# Patient Record
Sex: Male | Born: 1950 | Race: White | Hispanic: No | Marital: Married | Smoking: Current every day smoker
Health system: Southern US, Community
[De-identification: ages and names within clinical notes are randomized; demographics above are authoritative.]

## PROBLEM LIST (undated history)

## (undated) DIAGNOSIS — R17 Unspecified jaundice: Secondary | ICD-10-CM

## (undated) DIAGNOSIS — E119 Type 2 diabetes mellitus without complications: Secondary | ICD-10-CM

## (undated) DIAGNOSIS — N200 Calculus of kidney: Secondary | ICD-10-CM

## (undated) DIAGNOSIS — I10 Essential (primary) hypertension: Secondary | ICD-10-CM

## (undated) HISTORY — DX: Calculus of kidney: N20.0

## (undated) HISTORY — DX: Type 2 diabetes mellitus without complications: E11.9

## (undated) HISTORY — PX: CHOLECYSTECTOMY: SHX55

## (undated) HISTORY — DX: Essential (primary) hypertension: I10

---

## 1999-02-24 ENCOUNTER — Emergency Department (HOSPITAL_COMMUNITY): Admission: EM | Admit: 1999-02-24 | Discharge: 1999-02-24 | Payer: Self-pay | Admitting: Emergency Medicine

## 2003-08-24 HISTORY — PX: UMBILICAL HERNIA REPAIR: SHX196

## 2003-08-24 HISTORY — PX: APPENDECTOMY: SHX54

## 2003-12-09 ENCOUNTER — Inpatient Hospital Stay (HOSPITAL_COMMUNITY): Admission: EM | Admit: 2003-12-09 | Discharge: 2003-12-12 | Payer: Self-pay | Admitting: Emergency Medicine

## 2007-09-11 ENCOUNTER — Emergency Department (HOSPITAL_COMMUNITY): Admission: EM | Admit: 2007-09-11 | Discharge: 2007-09-11 | Payer: Self-pay | Admitting: Emergency Medicine

## 2010-07-28 ENCOUNTER — Emergency Department (HOSPITAL_COMMUNITY)
Admission: EM | Admit: 2010-07-28 | Discharge: 2010-07-28 | Payer: Self-pay | Source: Home / Self Care | Admitting: Emergency Medicine

## 2010-11-02 LAB — BASIC METABOLIC PANEL
BUN: 14 mg/dL (ref 6–23)
CO2: 27 mEq/L (ref 19–32)
Calcium: 9.7 mg/dL (ref 8.4–10.5)
Chloride: 99 mEq/L (ref 96–112)
Creatinine, Ser: 0.98 mg/dL (ref 0.4–1.5)
GFR calc non Af Amer: >60 mL/min (ref >60)
Potassium: 4.2 mEq/L (ref 3.5–5.1)

## 2010-11-02 LAB — URINALYSIS, ROUTINE W REFLEX MICROSCOPIC
Bilirubin Urine: NEGATIVE
Leukocytes, UA: NEGATIVE
Protein, ur: NEGATIVE mg/dL
Specific Gravity, Urine: >1.03 — ABNORMAL HIGH (ref 1.005–1.030)
Urobilinogen, UA: 0.2 mg/dL (ref 0.0–1.0)

## 2010-11-02 LAB — CBC
HCT: 48.2 % (ref 39.0–52.0)
Hemoglobin: 18 g/dL — ABNORMAL HIGH (ref 13.0–17.0)
RBC: 5.66 MIL/uL (ref 4.22–5.81)
RDW: 12.8 % (ref 11.5–15.5)
WBC: 16.2 10*3/uL — ABNORMAL HIGH (ref 4.0–10.5)

## 2010-11-02 LAB — URINE MICROSCOPIC-ADD ON

## 2010-11-02 LAB — URINE CULTURE: Colony Count: NO GROWTH

## 2011-01-08 NOTE — Discharge Summary (Signed)
NAME:  Anthony Mcmahon, Anthony Mcmahon                       ACCOUNT NO.:  [id number]   MEDICAL RECORD NO.:  [id number]                   PATIENT TYPE:  INP   LOCATION:  A322                                 FACILITY:  [facility]   PHYSICIAN:  Dalia Heading, M.D.               DATE OF BIRTH:  [birth year]   DATE OF ADMISSION:  12/07/2003  DATE OF DISCHARGE:  12/12/2003                                 DISCHARGE SUMMARY   HOSPITAL COURSE SUMMARY:  The patient is a 60 year old white male who  presented to the emergency room with right lower quadrant abdominal pain.  CT scan of the abdomen and pelvis revealed acute appendicitis with possible  perforation.  The patient was taken to the operating room on December 08, 2003  and underwent both a laparoscopic appendectomy and umbilical hernia.  An  acute appendicitis with perforation was noted. He was admitted to the  hospital for further treatment.  His postoperative course was remarkable for  a postoperative ileus which has subsequently resolved.  His diet was  advanced once his ileus had resolved. His white blood cell count has  returned to normal. His Jackson-Pratt drain in the right lower quadrant has  been removed.   The patient is being discharged home on December 12, 2003 in good and improving  condition.   DISCHARGE INSTRUCTIONS:  The patient is to followup with Dr. Franky Macho on  December 17, 2003.   DISCHARGE MEDICATIONS:  1. Tylenol or Motrin as needed for pain.  2. He is to resume his previous prescriptions as prescribed.   PRINCIPAL DIAGNOSES:  1. Acute appendicitis with perforation.  2. Umbilical hernia.   PRINCIPAL PROCEDURE:  On December 08, 2003:  1. Laparoscopic appendectomy.  2. Umbilical herniorrhaphy.     ___________________________________________                                         Dalia Heading, M.D.   MAJ/MEDQ  D:  12/12/2003  T:  12/13/2003  Job:  [id number]   cc:   Dalia Heading, M.D.  [address] [place]  [place] [address]  Fax: [phone]   Francoise Schaumann. Halm, D.O.  [address], [address]  [place]  [place] [address]  Fax: [phone]

## 2011-01-08 NOTE — Op Note (Signed)
NAME:  MAXWEL, MEADOWCROFT                       ACCOUNT NO.:  [id number]   MEDICAL RECORD NO.:  [id number]                   PATIENT TYPE:  EMS   LOCATION:  ED                                   FACILITY:  [facility]   PHYSICIAN:  Dalia Heading, M.D.               DATE OF BIRTH:  01/14/1951   DATE OF PROCEDURE:  12/08/2003  DATE OF DISCHARGE:                                 OPERATIVE REPORT   PREOPERATIVE DIAGNOSES:  1. Acute appendicitis.  2. Umbilical hernia.   POSTOPERATIVE DIAGNOSES:  1. Acute appendicitis, with perforation.  2. Umbilical hernia.   PROCEDURE:  1. Laparoscopic appendectomy.  2. Umbilical herniorrhaphy.   SURGEON:  Dalia Heading, M.D.   ANESTHESIA:  General endotracheal.   INDICATIONS:  The patient is a 60 year old white male who presents with  right lower quadrant abdominal pain to the emergency room.  CT scan of the  abdominal pain and pelvis reveals acute appendicitis with possible  perforation.  In addition, he has an umbilical hernia.  The risks and  benefits of the procedures including bleeding, infection, and the  possibility of an open procedure were fully explained to the patient, who  gave informed consent.   DESCRIPTION OF PROCEDURE:  The patient was placed in the supine position.  After induction of general endotracheal anesthesia, the  abdomen was prepped  and draped using the usual sterile technique with Betadine. Surgical site  confirmation was performed.   A supraumbilical incision was made down to the fascia.  A Veress needle was  introduced into the abdominal cavity and confirmation of placement was done  using the saline drop test.  The abdomen was then insufflated to 16 mmHg  pressure.  An 11-mm trocar was introduced into the abdominal cavity under  direct visualization without difficulty.  The patient was placed in deeper  Trendelenburg position and an additional 11-mm trocar was placed in the  suprapubic region and a 5-mm trocar was  placed in the left lower quadrant  region.  The right lower quadrant was inspected and a small amount of  purulent fluid was found.  This was evacuated without difficulty.  The small  bowel was noted to be overlying the appendix.  Fibrinous exudate was  present.  The appendix was mobilized to its base.  An endoGIA was placed  across the base of the appendix and fired.  The appendix was removed using  an EndoCatch bag without difficulty.  The staple line was inspected and  noted to be within normal limits.   The right lower quadrant was copiously irrigated with normal saline.  A #10  flat Jackson-Pratt drain was placed into the right lower quadrant and  brought out through the 5-mm trocar site.  This was secured at the skin  level using a 3-0 nylon interrupted suture.  All fluid and air were then  evacuated from the abdominal cavity prior to removal of  the trocars.   All wounds were irrigated with normal saline.  All wounds were injected with  0.5% Sensorcaine.  The supraumbilical incision was extended and the  umbilicus was freed away from the underlying fascia.  The incision and  umbilical hernia were then reapproximated using #0 [place] interrupted  sutures.  The base of the umbilicus was secured back to the fascia using a 2-  0 Vicryl interrupted suture.  The suprapubic fascia was reapproximated using  an #0 Vicryl interrupted suture.  The skin incisions were closed using  staples.  Betadine ointment and dry sterile dressings were applied.   All tape and needle counts were correct at the end of the procedure.  The  patient was extubated in the operating room and went back to recovery room  awake, in stable condition.   COMPLICATIONS:  None.   SPECIMEN:  Appendix.   BLOOD LOSS:  Less than 50 cc.   DRAINS:  Jackson-Pratt drain to the right lower quadrant.      ___________________________________________                                            Dalia Heading, M.D.    MAJ/MEDQ  D:  12/08/2003  T:  12/08/2003  Job:  [id number]   cc:   Dalia Heading, M.D.  [address] [place]  [place] [address]  Fax: [phone]   Francoise Schaumann. Halm, D.O.  [address], [address]  [place]  [place] [address]  Fax: [phone]

## 2011-05-13 LAB — DIFFERENTIAL
Basophils Absolute: 0.1
Basophils Relative: 1
Eosinophils Relative: 1
Lymphocytes Relative: 33
Lymphs Abs: 4.1 — ABNORMAL HIGH
Monocytes Absolute: 1
Neutrophils Relative %: 58

## 2011-05-13 LAB — BASIC METABOLIC PANEL
CO2: 24
Chloride: 104
Creatinine, Ser: 1.22
GFR calc Af Amer: >60
GFR calc non Af Amer: >60
Glucose, Bld: 178 — ABNORMAL HIGH

## 2011-05-13 LAB — URINALYSIS, ROUTINE W REFLEX MICROSCOPIC
Bilirubin Urine: NEGATIVE
Ketones, ur: NEGATIVE
Leukocytes, UA: NEGATIVE
Nitrite: NEGATIVE
Protein, ur: NEGATIVE
Urobilinogen, UA: 0.2

## 2011-05-13 LAB — CBC
Hemoglobin: 16.1
MCHC: 33.8
WBC: 12.6 — ABNORMAL HIGH

## 2011-07-05 ENCOUNTER — Telehealth: Payer: Self-pay

## 2011-07-05 NOTE — Telephone Encounter (Signed)
Called pt to triage for colonoscopy. He has a lot of loose stools. OV with Tana Coast 07/06/2011 @ 10:30 AM.

## 2011-07-06 ENCOUNTER — Encounter: Payer: Self-pay | Admitting: Gastroenterology

## 2011-07-06 ENCOUNTER — Ambulatory Visit (INDEPENDENT_AMBULATORY_CARE_PROVIDER_SITE_OTHER): Payer: Managed Care, Other (non HMO) | Admitting: Gastroenterology

## 2011-07-06 DIAGNOSIS — K589 Irritable bowel syndrome without diarrhea: Secondary | ICD-10-CM

## 2011-07-06 DIAGNOSIS — Z1211 Encounter for screening for malignant neoplasm of colon: Secondary | ICD-10-CM

## 2011-07-06 NOTE — Assessment & Plan Note (Signed)
Colon cancer screening due. I have discussed the risks, alternatives, benefits with regards to but not limited to the risk of reaction to medication, bleeding, infection, perforation and the patient is agreeable to proceed. Written consent to be obtained.  Day of prep, decrease metformin by 1/2.  Consider terminal ileoscopy at time of TCS given chronic bowel issues.

## 2011-07-06 NOTE — Patient Instructions (Signed)
We have scheduled you for a colonoscopy. Please see separate instructions. 

## 2011-07-06 NOTE — Progress Notes (Signed)
Primary Care Physician:  Ara Kussmaul, MD  Primary Gastroenterologist: Roetta Sessions, MD    Chief Complaint  Patient presents with  . Colonoscopy    HPI:  Anthony Mcmahon is a 60 y.o. male here to schedule colonoscopy. He had a colonoscopy over 20 years ago. He does not remember being told he had polyps. Overall he is doing well. Chronically he has intermittent constipation and diarrhea with occasional postprandial urgency. He may have irritable bowel syndrome. No melena, brbpr, abd pain. Rarely has heartburn, occasionally takes Zantac with certainty. Denies difficulty swallowing. He has lost 35 pounds since January with change in portion size. However he did find out recently that he is a diabetic and is now on metformin.  Current Outpatient Prescriptions  Medication Sig Dispense Refill  . metFORMIN (GLUCOPHAGE) 500 MG tablet Take 500 mg by mouth 2 (two) times daily with a meal.         Allergies as of 07/06/2011  . (No Known Allergies)    Past Medical History  Diagnosis Date  . HTN (hypertension)   . DM (diabetes mellitus)   . Nephrolithiasis     Past Surgical History  Procedure Date  . Umbilical hernia repair 2005  . Appendectomy 2005  . Cholecystectomy 1980s    Family History  Problem Relation Age of Onset  . Colon cancer Neg Hx   . GI problems Grandchild     Two grandsons with h/o Hirsprung's    History   Social History  . Marital Status: Married    Spouse Name: N/A    Number of Children: 2  . Years of Education: N/A   Occupational History  .  [employer]   Social History Main Topics  . Smoking status: Current Everyday Smoker -- 1.0 packs/day    Types: Cigarettes  . Smokeless tobacco: Not on file  . Alcohol Use: Yes     once -twice a month  . Drug Use: No  . Sexually Active: Not on file   Other Topics Concern  . Not on file   Social History Narrative  . No narrative on file      ROS:  General: Negative for anorexia, unintentional weight loss,  fever, chills, fatigue, weakness. Eyes: Negative for vision changes.  ENT: Negative for hoarseness, difficulty swallowing , nasal congestion. CV: Negative for chest pain, angina, palpitations, dyspnea on exertion, peripheral edema.  Respiratory: Negative for dyspnea at rest, dyspnea on exertion, cough, sputum, wheezing.  GI: See history of present illness. GU:  Negative for dysuria, hematuria, urinary incontinence, urinary frequency, nocturnal urination.  MS: Negative for joint pain, low back pain.  Derm: Negative for rash or itching.  Neuro: Negative for weakness, abnormal sensation, seizure, frequent headaches, memory loss, confusion.  Psych: Negative for anxiety, depression, suicidal ideation, hallucinations.  Endo: Negative for unusual weight change.  Heme: Negative for bruising or bleeding. Allergy: Negative for rash or hives.    Physical Examination:  BP 126/86  Pulse 69  Temp(Src) 97.3 F (36.3 C) (Temporal)  Ht 5\' 9"  (1.753 m)  Wt 216 lb 12.8 oz (98.34 kg)  BMI 32.02 kg/m2   General: Well-nourished, well-developed in no acute distress.  Head: Normocephalic, atraumatic.   Eyes: Conjunctiva pink, no icterus. Mouth: Oropharyngeal mucosa moist and pink , no lesions erythema or exudate. Neck: Supple without thyromegaly, masses, or lymphadenopathy.  Lungs: Clear to auscultation bilaterally.  Heart: Regular rate and rhythm, no murmurs rubs or gallops.  Abdomen: Bowel sounds are normal, very mild right  lower quadrant tenderness to palpation, nondistended, no hepatosplenomegaly or masses, no abdominal bruits or    hernia , no rebound or guarding.   Rectal: Deferred to time of colonoscopy. Extremities: No lower extremity edema. No clubbing or deformities.  Neuro: Alert and oriented x 4 , grossly normal neurologically.  Skin: Warm and dry, no rash or jaundice.   Psych: Alert and cooperative, normal mood and affect.  Labs: 06/25/2011. White blood cell count 9000, hemoglobin 16.9,  hematocrit 48.4, MCV 86.3, platelets 161,000, sodium 138, potassium 3.9, glucose 195, BUN 14, creatinine 0.83, total bilirubin 1.4, alkaline phosphatase 123, AST 20, ALT 23, albumin 4.1, calcium 9.2, TSH 1.2-1, hemoglobin A1c 11.7.

## 2011-07-06 NOTE — Assessment & Plan Note (Signed)
Chronic (20 year history) alternating constipation and diarrhea. No blood in the stool. Suspect irritable bowel syndrome. Denies abdominal pain but on exam has some minimal right lower quadrant tenderness. In 2005 he had pretty bad appendicitis requiring prolonged hospitalization for antibiotics. He may have some scar tissue. Consider ileoscopy at time of colonoscopy to make sure there is no Crohn's disease.

## 2011-07-07 NOTE — Progress Notes (Signed)
Cc to PCP 

## 2011-08-09 ENCOUNTER — Telehealth: Payer: Self-pay | Admitting: Internal Medicine

## 2011-08-09 NOTE — Telephone Encounter (Signed)
I spoke with the patient and explained his bill.  He voiced understanding.

## 2011-08-09 NOTE — Telephone Encounter (Signed)
Pt called to say he has a procedure on 08/30/11 and his insurance wasn't billed as a wellness program where it wouldn't cost him anything. I told him multiple times that we don't handle the billing and offered him the number to call. He would not take the number and said he was told he needed to call our office to resubmit the bill. I told him he could talk to my office manager when she returns. Please call him ASAP at [phone]

## 2011-08-27 ENCOUNTER — Telehealth: Payer: Self-pay | Admitting: Gastroenterology

## 2011-08-27 ENCOUNTER — Encounter (HOSPITAL_COMMUNITY): Payer: Self-pay | Admitting: Pharmacy Technician

## 2011-08-27 MED ORDER — SODIUM CHLORIDE 0.45 % IV SOLN
Freq: Once | INTRAVENOUS | Status: AC
  Administered 2011-08-30: 08:00:00 via INTRAVENOUS

## 2011-08-27 NOTE — Telephone Encounter (Signed)
Gastroenterology Pre-Procedure Form  Request Date: 08/27/11,  Requesting Physician:      PATIENT INFORMATION:  Anthony Mcmahon is a 61 y.o., male (DOB=12/22/1950).  PROCEDURE: Procedure(s) requested: colonoscopy Procedure Reason: screening for colon cancer  PATIENT REVIEW QUESTIONS: The patient reports the following:   1. Diabetes Melitis: Yes 2. Joint replacements in the past 12 months: no 3. Major health problems in the past 3 months: no 4. Has an artificial valve or MVP:no 5. Has been advised in past to take antibiotics in advance of a procedure like teeth cleaning: no}    MEDICATIONS & ALLERGIES:    Patient reports the following regarding taking any blood thinners:   Plavix? no Aspirin?no Coumadin?  no  Patient confirms/reports the following medications:  Current Outpatient Prescriptions  Medication Sig Dispense Refill  . metFORMIN (GLUCOPHAGE) 500 MG tablet Take 500 mg by mouth 2 (two) times daily with a meal.         Patient confirms/reports the following allergies:  No Known Allergies  Patient is appropriate to schedule for requested procedure(s): yes  AUTHORIZATION INFORMATION   No orders of the defined types were placed in this encounter.    SCHEDULE INFORMATION: Procedure has been scheduled as follows:  Date: 08/30/11, Time: 7:30  Location: [facility]   This Gastroenterology Pre-Precedure Form is being routed to the following provider(s) for review: R. Roetta Sessions, MD

## 2011-08-29 NOTE — Telephone Encounter (Signed)
Who is requesting / referring MD??????  Hold metformin night before procedure

## 2011-08-30 ENCOUNTER — Encounter (HOSPITAL_COMMUNITY)
Admission: RE | Disposition: A | Discharge status: Home / Self Care | Payer: Self-pay | Source: Ambulatory Visit | Attending: Internal Medicine

## 2011-08-30 ENCOUNTER — Encounter (HOSPITAL_COMMUNITY): Payer: Self-pay | Admitting: *Deleted

## 2011-08-30 ENCOUNTER — Ambulatory Visit (HOSPITAL_COMMUNITY)
Admission: RE | Admit: 2011-08-30 | Discharge: 2011-08-30 | Disposition: A | Discharge status: Home / Self Care | Payer: Managed Care, Other (non HMO) | Source: Ambulatory Visit | Attending: Internal Medicine | Admitting: Internal Medicine

## 2011-08-30 ENCOUNTER — Other Ambulatory Visit: Payer: Self-pay | Admitting: Internal Medicine

## 2011-08-30 DIAGNOSIS — K589 Irritable bowel syndrome without diarrhea: Secondary | ICD-10-CM

## 2011-08-30 DIAGNOSIS — D126 Benign neoplasm of colon, unspecified: Secondary | ICD-10-CM

## 2011-08-30 DIAGNOSIS — Z01812 Encounter for preprocedural laboratory examination: Secondary | ICD-10-CM | POA: Insufficient documentation

## 2011-08-30 DIAGNOSIS — Z1211 Encounter for screening for malignant neoplasm of colon: Secondary | ICD-10-CM

## 2011-08-30 DIAGNOSIS — Z79899 Other long term (current) drug therapy: Secondary | ICD-10-CM | POA: Insufficient documentation

## 2011-08-30 DIAGNOSIS — E119 Type 2 diabetes mellitus without complications: Secondary | ICD-10-CM | POA: Insufficient documentation

## 2011-08-30 DIAGNOSIS — I10 Essential (primary) hypertension: Secondary | ICD-10-CM | POA: Insufficient documentation

## 2011-08-30 HISTORY — PX: COLONOSCOPY: SHX5424

## 2011-08-30 LAB — GLUCOSE, CAPILLARY: Glucose-Capillary: 141 mg/dL — ABNORMAL HIGH (ref 70–99)

## 2011-08-30 SURGERY — COLONOSCOPY
Anesthesia: Moderate Sedation

## 2011-08-30 MED ORDER — STERILE WATER FOR IRRIGATION IR SOLN
Status: DC | PRN
  Administered 2011-08-30: 08:00:00

## 2011-08-30 MED ORDER — MEPERIDINE HCL 100 MG/ML IJ SOLN
INTRAMUSCULAR | Status: DC | PRN
  Administered 2011-08-30: 50 mg via INTRAVENOUS
  Administered 2011-08-30: 25 mg via INTRAVENOUS

## 2011-08-30 MED ORDER — MIDAZOLAM HCL 5 MG/5ML IJ SOLN
INTRAMUSCULAR | Status: AC
  Filled 2011-08-30: qty 10

## 2011-08-30 MED ORDER — MIDAZOLAM HCL 5 MG/5ML IJ SOLN
INTRAMUSCULAR | Status: DC | PRN
  Administered 2011-08-30 (×2): 1 mg via INTRAVENOUS
  Administered 2011-08-30: 2 mg via INTRAVENOUS

## 2011-08-30 MED ORDER — MEPERIDINE HCL 100 MG/ML IJ SOLN
INTRAMUSCULAR | Status: AC
  Filled 2011-08-30: qty 2

## 2011-08-30 NOTE — H&P (Signed)
  Primary Care Physician:  Ara Kussmaul, MD, MD Primary Gastroenterologist:  Dr. Jena Gauss  Pre-Procedure History & Physical: HPI:  Anthony Mcmahon is a 61 y.o. male is here for a screening colonoscopy. Rigid proctosigmoidoscopy by description some 20 years. No prior complete  colonoscopy. No alarm symptoms. No family history of colon cancer or polyps..  Past Medical History  Diagnosis Date  . HTN (hypertension)   . DM (diabetes mellitus)   . Nephrolithiasis     Past Surgical History  Procedure Date  . Umbilical hernia repair 2005  . Appendectomy 2005  . Cholecystectomy 1980s    Prior to Admission medications   Medication Sig Start Date End Date Taking? Authorizing Provider  metFORMIN (GLUCOPHAGE) 500 MG tablet Take 500 mg by mouth 2 (two) times daily with a meal.  07/01/11   Historical Provider, MD    Allergies as of 07/06/2011  . (No Known Allergies)    Family History  Problem Relation Age of Onset  . Colon cancer Neg Hx   . GI problems Grandchild     Two grandsons with h/o Hirsprung's    History   Social History  . Marital Status: Married    Spouse Name: N/A    Number of Children: 2  . Years of Education: N/A   Occupational History  .  [employer]   Social History Main Topics  . Smoking status: Current Everyday Smoker -- 1.0 packs/day    Types: Cigarettes  . Smokeless tobacco: Not on file  . Alcohol Use: Yes     once -twice a month  . Drug Use: No  . Sexually Active: Not on file   Other Topics Concern  . Not on file   Social History Narrative  . No narrative on file    Review of Systems: See HPI, otherwise negative ROS  Physical Exam: There were no vitals taken for this visit. General:   Alert,  Well-developed, well-nourished, pleasant and cooperative in NAD Head:  Normocephalic and atraumatic. Eyes:  Sclera clear, no icterus.   Conjunctiva pink. Ears:  Normal auditory acuity. Nose:  No deformity, discharge,  or lesions. Mouth:  No deformity or  lesions, dentition normal. Neck:  Supple; no masses or thyromegaly. Lungs:  Clear throughout to auscultation.   No wheezes, crackles, or rhonchi. No acute distress. Heart:  Regular rate and rhythm; no murmurs, clicks, rubs,  or gallops. Abdomen: Obese. Positive bowel sounds soft nontender without mass or organomegaly.  Msk:  Symmetrical without gross deformities. Normal posture. Pulses:  Normal pulses noted. Extremities:  Without clubbing or edema. Neurologic:  Alert and  oriented x4;  grossly normal neurologically. Skin:  Intact without significant lesions or rashes. Cervical Nodes:  No significant cervical adenopathy. Psych:  Alert and cooperative. Normal mood and affect.  Impression/Plan: Anthony Mcmahon is now here to undergo a screening colonoscopy. First-ever average risk screening examination via colonoscopy  Risks, benefits, limitations, imponderables and alternatives regarding colonoscopy have been reviewed with the patient. Questions have been answered. All parties agreeable.

## 2011-08-30 NOTE — Op Note (Signed)
[facility] [address] [place], [place]  [address]  COLONOSCOPY PROCEDURE REPORT  PATIENT:  Anthony Mcmahon, Anthony Mcmahon  MR#:  [id number] BIRTHDATE:  11/07/1950, 60 yrs. old  GENDER:  male ENDOSCOPIST:  R. Roetta Sessions, MD FACP [facility] REF. BY:          Dr. Acey Lav PROCEDURE DATE:  08/30/2011 PROCEDURE:  Colonoscopy  with biopsy and hot snare polypectomy  INDICATIONS:  first ever average risk screening colonoscopy  INFORMED CONSENT:  The risks, benefits, alternatives and imponderables including but not limited to bleeding, perforation as well as the possibility of a missed lesion have been reviewed. The potential for biopsy, lesion removal, etc. have also been discussed.  Questions have been answered.  All parties agreeable. Please see the history and physical in the medical record for more information.  MEDICATIONS:  Versed 4 mg IV a 75 mg IV in divided doses.  DESCRIPTION OF PROCEDURE:  After a digital rectal exam was performed, the EC-[id number] ([id number]) colonoscope was advanced from the anus through the rectum and colon to the area of the cecum, ileocecal valve and appendiceal orifice.  The cecum was deeply intubated.  These structures were well-seen and photographed for the record.  From the level of the cecum and ileocecal valve, the scope was slowly and cautiously withdrawn.  The mucosal surfaces were carefully surveyed utilizing scope tip deflection to facilitate fold flattening as needed.  The scope was pulled down into the rectum where a thorough examination including retroflexion was performed. <<PROCEDUREIMAGES>>  FINDINGS:    adequate preparation.  a single diminutive ascending colon polyp. A second 8 mm sessile polyp in the mid sigmoid segment;                                   otherwise, normal appearing colonic and rectal mucosa.  THERAPEUTIC / DIAGNOSTIC MANEUVERS PERFORMED: The descending colon polyp was removed with cold biopsy technique. The sigmoid  polyp was removed completely with hot snare cautery.  COMPLICATIONS:  none  CECAL WITHDRAWAL TIME:   14 minutes  IMPRESSION:   Colonic polyps-removed as described above.  RECOMMENDATIONS:    Further recommendations to follow.  ______________________________ R. Roetta Sessions, MD Caleen Essex  CC:  Ara Kussmaul MD  n. eSIGNED:   R. Roetta Sessions at 08/30/2011 08:32 AM  [place], [phone]

## 2011-09-01 ENCOUNTER — Encounter: Payer: Self-pay | Admitting: Internal Medicine

## 2011-09-02 ENCOUNTER — Encounter (HOSPITAL_COMMUNITY): Payer: Self-pay | Admitting: Internal Medicine

## 2015-10-27 ENCOUNTER — Encounter: Payer: Self-pay | Admitting: Gastroenterology

## 2015-10-27 ENCOUNTER — Ambulatory Visit (INDEPENDENT_AMBULATORY_CARE_PROVIDER_SITE_OTHER): Payer: BLUE CROSS/BLUE SHIELD | Admitting: Gastroenterology

## 2015-10-27 VITALS — BP 130/83 | HR 71 | Temp 97.6°F | Ht 69.0 in | Wt 202.0 lb

## 2015-10-27 DIAGNOSIS — R17 Unspecified jaundice: Secondary | ICD-10-CM | POA: Diagnosis not present

## 2015-10-27 DIAGNOSIS — R634 Abnormal weight loss: Secondary | ICD-10-CM

## 2015-10-27 LAB — COMPREHENSIVE METABOLIC PANEL
ALT: 95 U/L — ABNORMAL HIGH (ref 9–46)
AST: 47 U/L — ABNORMAL HIGH (ref 10–35)
Albumin: 3.8 g/dL (ref 3.6–5.1)
Alkaline Phosphatase: 349 U/L — ABNORMAL HIGH (ref 40–115)
BUN: 18 mg/dL (ref 7–25)
CALCIUM: 9.3 mg/dL (ref 8.6–10.3)
CHLORIDE: 97 mmol/L — AB (ref 98–110)
CO2: 26 mmol/L (ref 20–31)
Creat: 0.92 mg/dL (ref 0.70–1.25)
GLUCOSE: 374 mg/dL — AB (ref 65–99)
POTASSIUM: 4.4 mmol/L (ref 3.5–5.3)
Sodium: 133 mmol/L — ABNORMAL LOW (ref 135–146)
Total Bilirubin: 12.6 mg/dL — ABNORMAL HIGH (ref 0.2–1.2)
Total Protein: 6.6 g/dL (ref 6.1–8.1)

## 2015-10-27 LAB — CBC WITH DIFFERENTIAL/PLATELET
Basophils Absolute: 0 10*3/uL (ref 0.0–0.1)
Basophils Relative: 0 % (ref 0–1)
EOS ABS: 0.1 10*3/uL (ref 0.0–0.7)
EOS PCT: 1 % (ref 0–5)
HEMATOCRIT: 49.2 % (ref 39.0–52.0)
Hemoglobin: 16 g/dL (ref 13.0–17.0)
LYMPHS ABS: 2.9 10*3/uL (ref 0.7–4.0)
LYMPHS PCT: 28 % (ref 12–46)
MCH: 30 pg (ref 26.0–34.0)
MCHC: 32.5 g/dL (ref 30.0–36.0)
MCV: 92.3 fL (ref 78.0–100.0)
MONO ABS: 0.8 10*3/uL (ref 0.1–1.0)
MPV: 13.2 fL — ABNORMAL HIGH (ref 8.6–12.4)
Monocytes Relative: 8 % (ref 3–12)
Neutro Abs: 6.4 10*3/uL (ref 1.7–7.7)
Neutrophils Relative %: 63 % (ref 43–77)
PLATELETS: 226 10*3/uL (ref 150–400)
RBC: 5.33 MIL/uL (ref 4.22–5.81)
RDW: 14.9 % (ref 11.5–15.5)
WBC: 10.2 10*3/uL (ref 4.0–10.5)

## 2015-10-27 LAB — LIPASE: Lipase: 71 U/L — ABNORMAL HIGH (ref 7–60)

## 2015-10-27 LAB — PROTIME-INR
INR: 0.95 (ref <1.50)
Prothrombin Time: 12.8 seconds (ref 11.6–15.2)

## 2015-10-27 NOTE — Patient Instructions (Signed)
Please have your labs done today. I will call you with next step once I review your images with Dr. Gala Mcmahon.

## 2015-10-27 NOTE — Progress Notes (Signed)
Primary Care Physician:  Vicenta Aly, FNP  Primary Gastroenterologist:  Garfield Cornea, MD   Chief Complaint  Patient presents with  . Jaundice    abnormal labs    HPI:  Anthony Mcmahon is a 65 y.o. male here as urgent referral for jaundice at the request of Vicenta Aly, FNP. 2-3 weeks ago patient initially noted multiple loose stools daily. Stools were lighter in appearance. The frequency and consistency of his stool has improved. On February 23 he noted that his eyes were turning yellow. He was heading to [place] and delayed evaluation until he returned the following week. He saw his PCP who initiated workup. Noted to have a total bilirubin of 24.3 unconjugated, alkaline phosphatase 379, AST 69, ALT 153. Numbers had been fairly normal 2 months prior at his routine physical except for alkaline phosphatase of 139. Insurance company initially denied CT therefore abdominal ultrasound was done, outlined below. Nothing to explain jaundice. CT scan of the abdomen/pelvis shows extrahepatic biliary dilation bile duct diameter of 10 mm. No obvious stones or masses.  Overall his appetite has been fairly normal. He denies any nausea or vomiting. Reports an unintentional weight loss of approximately 25 pounds over the past 3 months. Stool has been clay-colored. Urine has been very dark. Denies any significant abdominal pain. He's had a little bit of indigestion. Bowel movements are regular at this point.  Current Outpatient Prescriptions  Medication Sig Dispense Refill  . Cholecalciferol (VITAMIN D) 2000 units tablet Take by mouth.    Marland Kitchen lisinopril (PRINIVIL,ZESTRIL) 5 MG tablet Take by mouth.    . meloxicam (MOBIC) 15 MG tablet Take by mouth.    . Probiotic Product (PROBIOTIC DAILY PO) Take by mouth daily.    . sitaGLIPtin (JANUVIA) 100 MG tablet Take by mouth.    . vitamin B-12 (CYANOCOBALAMIN) 1000 MCG tablet Take 1,000 mcg by mouth daily.     No current facility-administered medications  for this visit.    Allergies as of 10/27/2015  . (No Known Allergies)    Past Medical History  Diagnosis Date  . HTN (hypertension)   . DM (diabetes mellitus) ([place])   . Nephrolithiasis     Past Surgical History  Procedure Laterality Date  . Umbilical hernia repair  2005  . Appendectomy  2005  . Cholecystectomy  1980s  . Colonoscopy  08/30/2011    Dr. Gala Romney: Tubular adenomas. Next colonoscopy January 2018.    Family History  Problem Relation Age of Onset  . Colon cancer Neg Hx   . GI problems Grandchild     Two grandsons with h/o Hirsprung's    Social History   Social History  . Marital Status: Married    Spouse Name: N/A  . Number of Children: 2  . Years of Education: N/A   Occupational History  .  [employer]   Social History Main Topics  . Smoking status: Current Every Day Smoker -- 1.00 packs/day    Types: Cigarettes  . Smokeless tobacco: Not on file  . Alcohol Use: Yes     Comment: once -twice a month  . Drug Use: No  . Sexual Activity: Not on file   Other Topics Concern  . Not on file   Social History Narrative      ROS:  General: Negative for anorexia, fever, chills. Positive ftigue, weakness, weight loss-see history of present illness. Eyes: Negative for vision changes.  ENT: Negative for hoarseness, difficulty swallowing , nasal congestion. CV: Negative for chest pain, angina, palpitations,  dyspnea on exertion, peripheral edema.  Respiratory: Negative for dyspnea at rest, dyspnea on exertion, cough, sputum, wheezing.  GI: See history of present illness. GU:  Negative for dysuria, hematuria, urinary incontinence, urinary frequency, nocturnal urination.  MS: Negative for joint pain, low back pain.  Derm: Negative for rash or itching.  Neuro: Negative for weakness, abnormal sensation, seizure, frequent headaches, memory loss, confusion.  Psych: Negative for anxiety, depression, suicidal ideation, hallucinations.  Endo: See history of present illness   Heme: Negative for bruising or bleeding. Allergy: Negative for rash or hives.    Physical Examination:  BP 130/83 mmHg  Pulse 71  Temp(Src) 97.6 F (36.4 C) (Oral)  Ht 5\' 9"  (1.753 m)  Wt 202 lb (91.627 kg)  BMI 29.82 kg/m2   General: Well-nourished, well-developed in no acute distress.  Head: Normocephalic, atraumatic.   Eyes: Conjunctiva pink, + icterus. Mouth: Oropharyngeal mucosa moist and pink , no lesions erythema or exudate. Neck: Supple without thyromegaly, masses, or lymphadenopathy.  Lungs: Clear to auscultation bilaterally.  Heart: Regular rate and rhythm, no murmurs rubs or gallops.  Abdomen: Bowel sounds are normal, minimal right upper quadrant tenderness, nondistended, no hepatosplenomegaly or masses, no abdominal bruits or    hernia , no rebound or guarding.   Rectal: Not performed Extremities: No lower extremity edema. No clubbing or deformities.  Neuro: Alert and oriented x 4 , grossly normal neurologically.  Skin: Warm and dry, no rash. Deep jaundice.   Psych: Alert and cooperative, normal mood and affect.  Labs: Labs from 10/22/2015 INR 1.1, white blood cell count 8900, hemoglobin 15.5, platelets 184,000, total bilirubin 24.3, alkaline phosphatase 379, AST 69, ALT 153, albumin 3.6, sodium 1:30, potassium 4.1, calcium 9.2, BUN 12, creatinine 0.72. Of note, LFTs were normal 2 months ago except for alkaline phosphatase of 139. Hemoglobin A1c 10.2, amylase 27, lipase 59, hepatitis A IgM negative, hepatitis B surface antigen negative, hepatitis B core IgM antibody negative, hepatitis C antibody less than 0.1.  Ferritin of 463 in January 2017 TSH 1.750 December 2016, PSA 1.27 July 2015  Imaging Studies: From care everywhere, [facility]  10/23/2015 CT of the abdomen and pelvis with contrast showed no focal liver lesion, extrahepatic main bile duct measuring 10 mm status post cholecystectomy, normal prostate  10/22/2015 Abdominal ultrasound showed mild  splenomegaly with 13 cm spleen, question of prostatic lesion, 1.7 cm hyperechoic lesion.

## 2015-10-27 NOTE — Assessment & Plan Note (Signed)
65 year old gentleman with painless jaundice which began a couple of weeks ago. Noted extrahepatic biliary dilation on CT but no obvious mass or stones. Concern for underlying malignancy. Cannot exclude biliary stricture. Patient denies significant alcohol use, therefore alcoholic hepatitis less likely. Discussed with Dr. Gala Romney. Patient will require decompression in the near future however given concern for possible underlying occult malignancy, he may be best served with EUS as initial step plus or minus ERCP to look for underlying malignancy plus or minus tissue sampling. I will touch base with Dr. Owens Loffler to see if he would agree to assist [place]. We will update his labs today. Further recommendations to follow.  Out of work note for the rest the week. Will reevaluate as we go.

## 2015-10-28 ENCOUNTER — Telehealth: Payer: Self-pay | Admitting: Internal Medicine

## 2015-10-28 ENCOUNTER — Other Ambulatory Visit: Payer: Self-pay

## 2015-10-28 ENCOUNTER — Other Ambulatory Visit: Payer: Self-pay | Admitting: Gastroenterology

## 2015-10-28 DIAGNOSIS — R17 Unspecified jaundice: Secondary | ICD-10-CM

## 2015-10-28 DIAGNOSIS — R634 Abnormal weight loss: Secondary | ICD-10-CM

## 2015-10-28 NOTE — Telephone Encounter (Signed)
Lab orders done and faxed to the lab.

## 2015-10-28 NOTE — Telephone Encounter (Signed)
Outside CT films reviewed with Dr. Thornton Papas. Extrahepatic bile duct abnormal -  enhances diffusely.-Likely inflammatory or neoplastic per Dr. Thornton Papas. Biliary tree felt not to be obstructed/dilated. Pancreas looks entirely normal. Surrounding Nonspecific Lymph Nodes.  Looks like bilirubin has dropped by nearly 50% in a week's time. Aside from jaundice, patient asymptomatic.  I do not see a need for an urgent ERCP at this time. However, further evaluation will be needed. Let's do a hepatic function profile the end of the week and see which way LFTs are going. Patient will need further imaging of his biliary tree-to be determined.

## 2015-10-28 NOTE — Progress Notes (Signed)
CC'ED TO PCP 

## 2015-10-28 NOTE — Telephone Encounter (Addendum)
Spoke to patient. He will go first thing Thursday morning for labs. He still has no fever or abdominal pain. He will call if symptoms change at all.

## 2015-10-28 NOTE — Telephone Encounter (Addendum)
I tried to call patient's home phone number, LMOAM to return call. LMOAM for cell phone.  Requested patient to call the office. If I am not here, I gave him my number to call me.  We need to have him go for labs first thing Thursday morning as soon as they open. Need STAT LFTs, lipase, CBC.

## 2015-10-30 ENCOUNTER — Other Ambulatory Visit (INDEPENDENT_AMBULATORY_CARE_PROVIDER_SITE_OTHER): Payer: Self-pay | Admitting: Internal Medicine

## 2015-10-30 ENCOUNTER — Telehealth: Payer: Self-pay | Admitting: Gastroenterology

## 2015-10-30 DIAGNOSIS — K831 Obstruction of bile duct: Secondary | ICD-10-CM

## 2015-10-30 LAB — CBC WITH DIFFERENTIAL/PLATELET
BASOS ABS: 0 10*3/uL (ref 0.0–0.1)
BASOS PCT: 0 % (ref 0–1)
EOS ABS: 0.1 10*3/uL (ref 0.0–0.7)
Eosinophils Relative: 1 % (ref 0–5)
HCT: 45.5 % (ref 39.0–52.0)
HEMOGLOBIN: 15 g/dL (ref 13.0–17.0)
Lymphocytes Relative: 34 % (ref 12–46)
Lymphs Abs: 3.6 10*3/uL (ref 0.7–4.0)
MCH: 29.9 pg (ref 26.0–34.0)
MCHC: 33 g/dL (ref 30.0–36.0)
MCV: 90.8 fL (ref 78.0–100.0)
MPV: 12.7 fL — AB (ref 8.6–12.4)
Monocytes Absolute: 1 10*3/uL (ref 0.1–1.0)
Monocytes Relative: 9 % (ref 3–12)
NEUTROS ABS: 6 10*3/uL (ref 1.7–7.7)
NEUTROS PCT: 56 % (ref 43–77)
PLATELETS: 229 10*3/uL (ref 150–400)
RBC: 5.01 MIL/uL (ref 4.22–5.81)
RDW: 14.4 % (ref 11.5–15.5)
WBC: 10.7 10*3/uL — ABNORMAL HIGH (ref 4.0–10.5)

## 2015-10-30 LAB — HEPATIC FUNCTION PANEL
ALBUMIN: 3.7 g/dL (ref 3.6–5.1)
ALK PHOS: 313 U/L — AB (ref 40–115)
ALT: 123 U/L — ABNORMAL HIGH (ref 9–46)
AST: 87 U/L — ABNORMAL HIGH (ref 10–35)
BILIRUBIN DIRECT: 6.2 mg/dL — AB (ref <=0.2)
BILIRUBIN INDIRECT: 5.3 mg/dL — AB (ref 0.2–1.2)
BILIRUBIN TOTAL: 11.5 mg/dL — AB (ref 0.2–1.2)
Total Protein: 6.3 g/dL (ref 6.1–8.1)

## 2015-10-30 LAB — LIPASE: Lipase: 417 U/L — ABNORMAL HIGH (ref 7–60)

## 2015-10-30 NOTE — Progress Notes (Signed)
Quick Note:  Patient aware. ______ 

## 2015-10-30 NOTE — Progress Notes (Signed)
Quick Note:  Patient needs ERCP for diagnostic purposes and decompression. Discussed with Dr. Gala Romney who is in agreement. I discussed the case with Dr. Laural Golden who is willing to perform ERCP tomorrow in the absence of Dr. Gala Romney. I spoke with the patient, brought him up to date on the current recommendations. The risks, benefits, limitations, alternatives, and imponderables have been reviewed with the patient. I specifically discussed a 1 in 10 chance of pancreatitis, reaction to medications, bleeding, perforation and the possibility of a failed ERCP. Potential for sphincterotomy and stent placement also reviewed. Questions have been answered. All parties agreeable.  Patient is agreeable to have ERCP with Dr. Laural Golden. Dr. Laural Golden said his staff would contact patient and let him know further instructions. ______

## 2015-10-30 NOTE — Telephone Encounter (Signed)
I spoke to patient's PCP, Vicenta Aly, NP. Updated her on patient's condition and plan for ERCP tomorrow.

## 2015-10-31 ENCOUNTER — Encounter (HOSPITAL_COMMUNITY): Payer: Self-pay | Admitting: Certified Registered Nurse Anesthetist

## 2015-10-31 ENCOUNTER — Ambulatory Visit (HOSPITAL_COMMUNITY): Payer: BLUE CROSS/BLUE SHIELD

## 2015-10-31 ENCOUNTER — Encounter (HOSPITAL_COMMUNITY)
Admission: RE | Disposition: A | Discharge status: Home / Self Care | Payer: Self-pay | Source: Ambulatory Visit | Attending: Internal Medicine

## 2015-10-31 ENCOUNTER — Telehealth: Payer: Self-pay | Admitting: Gastroenterology

## 2015-10-31 ENCOUNTER — Ambulatory Visit (HOSPITAL_COMMUNITY): Payer: BLUE CROSS/BLUE SHIELD | Admitting: Anesthesiology

## 2015-10-31 ENCOUNTER — Ambulatory Visit (HOSPITAL_COMMUNITY)
Admission: RE | Admit: 2015-10-31 | Discharge: 2015-10-31 | Disposition: A | Discharge status: Home / Self Care | Payer: BLUE CROSS/BLUE SHIELD | Source: Ambulatory Visit | Attending: Internal Medicine | Admitting: Internal Medicine

## 2015-10-31 DIAGNOSIS — Z803 Family history of malignant neoplasm of breast: Secondary | ICD-10-CM | POA: Insufficient documentation

## 2015-10-31 DIAGNOSIS — K8051 Calculus of bile duct without cholangitis or cholecystitis with obstruction: Secondary | ICD-10-CM | POA: Diagnosis not present

## 2015-10-31 DIAGNOSIS — Z8042 Family history of malignant neoplasm of prostate: Secondary | ICD-10-CM | POA: Insufficient documentation

## 2015-10-31 DIAGNOSIS — F17210 Nicotine dependence, cigarettes, uncomplicated: Secondary | ICD-10-CM | POA: Diagnosis not present

## 2015-10-31 DIAGNOSIS — R740 Nonspecific elevation of levels of transaminase and lactic acid dehydrogenase [LDH]: Secondary | ICD-10-CM | POA: Insufficient documentation

## 2015-10-31 DIAGNOSIS — R17 Unspecified jaundice: Secondary | ICD-10-CM

## 2015-10-31 DIAGNOSIS — E119 Type 2 diabetes mellitus without complications: Secondary | ICD-10-CM | POA: Diagnosis not present

## 2015-10-31 DIAGNOSIS — Z8249 Family history of ischemic heart disease and other diseases of the circulatory system: Secondary | ICD-10-CM | POA: Diagnosis not present

## 2015-10-31 DIAGNOSIS — I10 Essential (primary) hypertension: Secondary | ICD-10-CM | POA: Insufficient documentation

## 2015-10-31 DIAGNOSIS — K831 Obstruction of bile duct: Secondary | ICD-10-CM

## 2015-10-31 DIAGNOSIS — Z01810 Encounter for preprocedural cardiovascular examination: Secondary | ICD-10-CM | POA: Insufficient documentation

## 2015-10-31 DIAGNOSIS — K8020 Calculus of gallbladder without cholecystitis without obstruction: Secondary | ICD-10-CM

## 2015-10-31 HISTORY — DX: Unspecified jaundice: R17

## 2015-10-31 HISTORY — PX: ERCP: SHX5425

## 2015-10-31 HISTORY — PX: SPHINCTEROTOMY: SHX5544

## 2015-10-31 LAB — GLUCOSE, CAPILLARY
Glucose-Capillary: 261 mg/dL — ABNORMAL HIGH (ref 65–99)
Glucose-Capillary: 329 mg/dL — ABNORMAL HIGH (ref 65–99)

## 2015-10-31 SURGERY — ERCP, WITH INTERVENTION IF INDICATED
Anesthesia: General

## 2015-10-31 MED ORDER — FENTANYL CITRATE (PF) 100 MCG/2ML IJ SOLN
INTRAMUSCULAR | Status: AC
  Filled 2015-10-31: qty 2

## 2015-10-31 MED ORDER — SODIUM CHLORIDE 0.9 % IV SOLN
INTRAVENOUS | Status: DC | PRN
  Administered 2015-10-31: 100 mL

## 2015-10-31 MED ORDER — GLYCOPYRROLATE 0.2 MG/ML IJ SOLN
0.2000 mg | Freq: Once | INTRAMUSCULAR | Status: AC
  Administered 2015-10-31: 0.2 mg via INTRAVENOUS

## 2015-10-31 MED ORDER — LACTATED RINGERS IV SOLN
INTRAVENOUS | Status: DC
  Administered 2015-10-31: 08:00:00 via INTRAVENOUS

## 2015-10-31 MED ORDER — SODIUM CHLORIDE 0.9 % IJ SOLN
INTRAMUSCULAR | Status: AC
  Filled 2015-10-31: qty 20

## 2015-10-31 MED ORDER — ROCURONIUM BROMIDE 50 MG/5ML IV SOLN
INTRAVENOUS | Status: AC
  Filled 2015-10-31: qty 1

## 2015-10-31 MED ORDER — SODIUM CHLORIDE 0.9 % IJ SOLN
INTRAMUSCULAR | Status: AC
  Filled 2015-10-31: qty 10

## 2015-10-31 MED ORDER — ATROPINE SULFATE 1 MG/ML IJ SOLN
INTRAMUSCULAR | Status: AC
  Filled 2015-10-31: qty 1

## 2015-10-31 MED ORDER — CEFAZOLIN SODIUM-DEXTROSE 2-3 GM-% IV SOLR
2.0000 g | INTRAVENOUS | Status: AC
  Administered 2015-10-31: 2 g via INTRAVENOUS

## 2015-10-31 MED ORDER — GLYCOPYRROLATE 0.2 MG/ML IJ SOLN
INTRAMUSCULAR | Status: AC
  Filled 2015-10-31: qty 1

## 2015-10-31 MED ORDER — ONDANSETRON HCL 4 MG/2ML IJ SOLN
4.0000 mg | Freq: Once | INTRAMUSCULAR | Status: AC
  Administered 2015-10-31: 4 mg via INTRAVENOUS

## 2015-10-31 MED ORDER — FENTANYL CITRATE (PF) 100 MCG/2ML IJ SOLN: 25.0000 ug | INTRAMUSCULAR | Status: DC | PRN

## 2015-10-31 MED ORDER — LIDOCAINE HCL (PF) 1 % IJ SOLN
INTRAMUSCULAR | Status: DC | PRN
  Administered 2015-10-31: 50 mg

## 2015-10-31 MED ORDER — ONDANSETRON HCL 4 MG/2ML IJ SOLN
INTRAMUSCULAR | Status: AC
  Filled 2015-10-31: qty 2

## 2015-10-31 MED ORDER — ONDANSETRON HCL 4 MG/2ML IJ SOLN
INTRAMUSCULAR | Status: DC | PRN
  Administered 2015-10-31: 4 mg via INTRAVENOUS

## 2015-10-31 MED ORDER — PROPOFOL 10 MG/ML IV BOLUS
INTRAVENOUS | Status: DC | PRN
  Administered 2015-10-31: 180 mg via INTRAVENOUS

## 2015-10-31 MED ORDER — LIDOCAINE HCL (PF) 1 % IJ SOLN
INTRAMUSCULAR | Status: AC
  Filled 2015-10-31: qty 5

## 2015-10-31 MED ORDER — PROPOFOL 10 MG/ML IV BOLUS
INTRAVENOUS | Status: AC
  Filled 2015-10-31: qty 20

## 2015-10-31 MED ORDER — GLUCAGON HCL RDNA (DIAGNOSTIC) 1 MG IJ SOLR
INTRAMUSCULAR | Status: AC
  Administered 2015-10-31 (×3): 0.25 mg via INTRAVENOUS
  Filled 2015-10-31: qty 2

## 2015-10-31 MED ORDER — METOPROLOL TARTRATE 1 MG/ML IV SOLN
INTRAVENOUS | Status: AC
  Filled 2015-10-31: qty 5

## 2015-10-31 MED ORDER — EPHEDRINE SULFATE 50 MG/ML IJ SOLN
INTRAMUSCULAR | Status: AC
  Filled 2015-10-31: qty 1

## 2015-10-31 MED ORDER — ONDANSETRON HCL 4 MG/2ML IJ SOLN: 4.0000 mg | Freq: Once | INTRAMUSCULAR | Status: DC | PRN

## 2015-10-31 MED ORDER — INSULIN ASPART 100 UNIT/ML ~~LOC~~ SOLN
6.0000 [IU] | Freq: Once | SUBCUTANEOUS | Status: DC
  Filled 2015-10-31: qty 0.06

## 2015-10-31 MED ORDER — MIDAZOLAM HCL 2 MG/2ML IJ SOLN
1.0000 mg | INTRAMUSCULAR | Status: DC | PRN
  Administered 2015-10-31: 2 mg via INTRAVENOUS

## 2015-10-31 MED ORDER — ATROPINE SULFATE 0.4 MG/ML IJ SOLN
INTRAMUSCULAR | Status: AC
  Filled 2015-10-31: qty 1

## 2015-10-31 MED ORDER — SODIUM CHLORIDE 0.9 % IV SOLN
INTRAVENOUS | Status: AC
  Filled 2015-10-31: qty 100

## 2015-10-31 MED ORDER — SUCCINYLCHOLINE CHLORIDE 20 MG/ML IJ SOLN
INTRAMUSCULAR | Status: AC
  Filled 2015-10-31: qty 1

## 2015-10-31 MED ORDER — PHENYLEPHRINE 40 MCG/ML (10ML) SYRINGE FOR IV PUSH (FOR BLOOD PRESSURE SUPPORT)
PREFILLED_SYRINGE | INTRAVENOUS | Status: AC
  Filled 2015-10-31: qty 10

## 2015-10-31 MED ORDER — FENTANYL CITRATE (PF) 100 MCG/2ML IJ SOLN
INTRAMUSCULAR | Status: DC | PRN
  Administered 2015-10-31 (×2): 50 ug via INTRAVENOUS
  Administered 2015-10-31: 100 ug via INTRAVENOUS

## 2015-10-31 MED ORDER — SUCCINYLCHOLINE CHLORIDE 20 MG/ML IJ SOLN
INTRAMUSCULAR | Status: DC | PRN
  Administered 2015-10-31: 120 mg via INTRAVENOUS

## 2015-10-31 MED ORDER — MIDAZOLAM HCL 2 MG/2ML IJ SOLN
INTRAMUSCULAR | Status: AC
  Filled 2015-10-31: qty 2

## 2015-10-31 MED ORDER — CEFAZOLIN SODIUM-DEXTROSE 2-3 GM-% IV SOLR
INTRAVENOUS | Status: AC
  Filled 2015-10-31: qty 50

## 2015-10-31 NOTE — Op Note (Signed)
[facility] Patient Name: Anthony Mcmahon Procedure Date: 10/31/2015 8:23 AM MRN: [id number] Date of Birth: 1950-11-02 Attending MD: Hildred Laser , MD CSN: [id number] Age: 66 Admit Type: Inpatient Procedure:                ERCP Indications:              Jaundice Providers:                Hildred Laser, MD, Janeece Riggers, RN, Gwenlyn Fudge,                            RN, Randa Spike, Technician, Georgeann Oppenheim,                            Technician Referring MD:              Medicines:                General Anesthesia Complications:            No immediate complications. Estimated Blood Loss:     Estimated blood loss: none. Procedure:                Pre-Anesthesia Assessment:                           - Prior to the procedure, a History and Physical                            was performed, and patient medications and                            allergies were reviewed. The patient's tolerance of                            previous anesthesia was also reviewed. The risks                            and benefits of the procedure and the sedation                            options and risks were discussed with the patient.                            All questions were answered, and informed consent                            was obtained. Prior Anticoagulants: The patient has                            taken previous NSAID medication, last dose was 1                            day prior to procedure. ASA Grade Assessment: I - A                            normal, healthy  patient. After reviewing the risks                            and benefits, the patient was deemed in                            satisfactory condition to undergo the procedure.                           After obtaining informed consent, the scope was                            passed under direct vision. Throughout the                            procedure, the patient's blood pressure, pulse, and           oxygen saturations were monitored continuously. The                            [id number]) scope was introduced through                            the mouth, and used to inject contrast into and                            used to inject contrast into the bile duct. The                            ERCP was accomplished without difficulty. The                            patient tolerated the procedure well. Scope In: 9:27:29 AM Scope Out: 9:49:40 AM Total Procedure Duration: 0 hours 22 minutes 11 seconds  Findings:      The scout film was normal. The upper GI tract was traversed under direct       vision without detailed examination. The upper GI tract was normal. The       major papilla was normal. The bile duct was deeply cannulated with the       traction (standard) sphincterotome. Contrast was injected. I personally       interpreted the bile duct images. Ductal flow of contrast was adequate.       Image quality was excellent. Contrast extended to the entire biliary       tree. The common bile duct and common hepatic duct were mildly dilated,       with a stone causing an obstruction. The largest diameter was 12 mm. The       biliary sphincterotomy was extended to a total of 8 mm in length with a       Autotome sphincterotome using ERBE electrocautery. There was no       post-sphincterotomy bleeding. To discover objects, the biliary tree was       swept with a 9 mm balloon and 12 mm balloon starting at the upper third       of the main bile duct, middle third of the main  bile duct and lower       third of the main duct. One stone was removed. No stones remained. Impression:               - The major papilla appeared normal.                           - The common bile duct and common hepatic duct were                            mildly dilated, with a stone causing an obstruction.                           - Choledocholithiasis was found. Complete removal                             was accomplished by biliary sphincterotomy and                            balloon extraction.                           - A biliary sphincterotomy was performed.                           - The biliary tree was swept. Moderate Sedation:      Per Anesthesia Care Recommendation:           - Avoid aspirin and nonsteroidal anti-inflammatory                            medicines for 3 days.                           - Clear liquid diet today.                           - Diabetic (ADA) diet starting tomorrow.                           - Continue present medications.                           - Return to normal activities tomorrow.                           - Return to GI clinic in 7 days. Procedure Code(s):        --- Professional ---                           [phone], Endoscopic retrograde                            cholangiopancreatography (ERCP); with removal of                            calculi/debris from biliary/pancreatic duct(s)  43262, Endoscopic retrograde                            cholangiopancreatography (ERCP); with                            sphincterotomy/papillotomy Diagnosis Code(s):        --- Professional ---                           K80.50, Calculus of bile duct without cholangitis                            or cholecystitis without obstruction                           K80.51, Calculus of bile duct without cholangitis                            or cholecystitis with obstruction                           R17, Unspecified jaundice CPT copyright 2016 American Medical Association. All rights reserved. The codes documented in this report are preliminary and upon coder review may  be revised to meet current compliance requirements. Hildred Laser, MD Hildred Laser, MD 10/31/2015 10:18:53 AM This report has been signed electronically. Number of Addenda: 0

## 2015-10-31 NOTE — Anesthesia Preprocedure Evaluation (Addendum)
Anesthesia Evaluation  Patient identified by MRN, date of birth, ID band Patient awake    Reviewed: Allergy & Precautions, NPO status , Patient's Chart, lab work & pertinent test results  Airway Mallampati: III  TM Distance: >3 FB     Dental  (+) Teeth Intact, Dental Advisory Given   Pulmonary Current Smoker,    breath sounds clear to auscultation       Cardiovascular hypertension,  Rhythm:Regular Rate:Normal     Neuro/Psych    GI/Hepatic Obstructive jaundice    Endo/Other  diabetes, Poorly Controlled, Type 2  Renal/GU      Musculoskeletal   Abdominal   Peds  Hematology   Anesthesia Other Findings   Reproductive/Obstetrics                          Anesthesia Physical Anesthesia Plan  ASA: III  Anesthesia Plan: General   Post-op Pain Management:    Induction: Intravenous  Airway Management Planned: Oral ETT  Additional Equipment:   Intra-op Plan:   Post-operative Plan:   Informed Consent: I have reviewed the patients History and Physical, chart, labs and discussed the procedure including the risks, benefits and alternatives for the proposed anesthesia with the patient or authorized representative who has indicated his/her understanding and acceptance.     Plan Discussed with:   Anesthesia Plan Comments: (CBG=261 this AM.)        Anesthesia Quick Evaluation

## 2015-10-31 NOTE — H&P (Signed)
Anthony Mcmahon is an 65 y.o. male.   Chief Complaint: Patient is here for ERCP and biliary stenting. HPI: Patient is 65 year old Caucasian male who noted himself to be jaundice about 2 weeks ago. He was seen by PCP and noted to have bilirubin of around 24 and elevated transaminases and alkaline phosphatase. Ultrasound revealed splenomegaly and a question of prostatic lesion. Patient had abdominopelvic CT on 3 10/19/2015 revealing mildly dilated bile duct measuring 10 mm. Patient's bilirubin has come down but still elevated at 11.5. AP was 313 AST 87 and ALT 123. Patient has been evaluated at [facility] and sent over for diagnostic and therapeutic ERCP because Dr. Gala Romney and Dr. Oneida Alar are out of town and I'm covering for them. Please note patient has noted itching over the last few weeks. His stool is light in color and urine is orange in color. He denies abdominal pain nausea or vomiting. He's had a good appetite but in spite of has good appetite he has lost 20 pounds. Ration drinks alcohol very occasionally. He smokes a pack of cigarettes per day. He he is in Conservation officer, [employer]. Family history significant for breast carcinoma in mother who was 77 at the time of diagnosis and died at 38. Brother had prostate cancer at 59 and died at 47. Father has CAD and is doing reasonably well at age 52.  Past Medical History  Diagnosis Date  . HTN (hypertension)   . DM (diabetes mellitus) ([place])   . Nephrolithiasis     Past Surgical History  Procedure Laterality Date  . Umbilical hernia repair  2005  . Appendectomy  2005  . Cholecystectomy  1980s  . Colonoscopy  08/30/2011    Dr. Gala Romney: Tubular adenomas. Next colonoscopy January 2018.    Family History  Problem Relation Age of Onset  . Colon cancer Neg Hx   . GI problems Grandchild     Two grandsons with h/o Hirsprung's   Social History:  reports that he has been smoking Cigarettes.  He has been smoking about 1.00 pack per day. He does not have any  smokeless tobacco history on file. He reports that he drinks alcohol. He reports that he does not use illicit drugs.  Allergies: No Known Allergies  Medications Prior to Admission  Medication Sig Dispense Refill  . Cholecalciferol (VITAMIN D) 2000 units tablet Take by mouth.    Marland Kitchen lisinopril (PRINIVIL,ZESTRIL) 5 MG tablet Take by mouth.    . meloxicam (MOBIC) 15 MG tablet Take by mouth.    . Probiotic Product (PROBIOTIC DAILY PO) Take by mouth daily.    . sitaGLIPtin (JANUVIA) 100 MG tablet Take by mouth.    . vitamin B-12 (CYANOCOBALAMIN) 1000 MCG tablet Take 1,000 mcg by mouth daily.      Results for orders placed or performed during the hospital encounter of 10/31/15 (from the past 48 hour(s))  Glucose, capillary     Status: Abnormal   Collection Time: 10/31/15  7:48 AM  Result Value Ref Range   Glucose-Capillary 261 (H) 65 - 99 mg/dL   No results found.  ROS  Blood pressure 129/83, pulse 66, temperature 97.3 F (36.3 C), temperature source Oral, resp. rate 14, height 5\' 9"  (1.753 m), weight 202 lb (91.627 kg), SpO2 96 %. Physical Exam  Constitutional: He appears well-developed and well-nourished.  HENT:  Mouth/Throat: Oropharynx is clear and moist.  Eyes: Scleral icterus is present.  Neck: No thyromegaly present.  Cardiovascular: Normal rate, regular rhythm and normal heart sounds.  No murmur heard. Respiratory: Effort normal and breath sounds normal.  GI:  Abdomen is full but symmetrical with right subcostal scar from previous cholecystectomy. Abdomen is soft and nontender without organomegaly or masses.  Musculoskeletal: He exhibits no edema.  Lymphadenopathy:    He has no cervical adenopathy.  Neurological: He is alert.  Skin: Skin is warm and dry.     Assessment/Plan Obstructive jaundice. ERCP with biliary stenting.  Rogene Houston, MD 10/31/2015, 8:50 AM

## 2015-10-31 NOTE — Anesthesia Procedure Notes (Signed)
Procedure Name: Intubation Date/Time: 10/31/2015 9:13 AM Performed by: Gershon Mussel, Banyan Goodchild Pre-anesthesia Checklist: Patient identified, Patient being monitored, Timeout performed, Emergency Drugs available and Suction available Patient Re-evaluated:Patient Re-evaluated prior to inductionOxygen Delivery Method: Circle System Utilized Preoxygenation: Pre-oxygenation with 100% oxygen Intubation Type: IV induction Ventilation: Mask ventilation without difficulty Laryngoscope Size: Miller and 2 Grade View: Grade I Tube type: Oral Tube size: 7.0 mm Number of attempts: 1 Airway Equipment and Method: Stylet Placement Confirmation: ETT inserted through vocal cords under direct vision,  positive ETCO2 and breath sounds checked- equal and bilateral Secured at: 21 cm Tube secured with: Tape Dental Injury: Teeth and Oropharynx as per pre-operative assessment

## 2015-10-31 NOTE — Discharge Instructions (Signed)
No aspirin or NSAIDs for 3 days. Clear liquids today than usual diet starting tomorrow morning. No driving for 24 hours. Follow-up at [place] in one week. Follow up March 23 at 3:30 with Neil Crouch  Endoscopic Retrograde Cholangiopancreatography (ERCP), Care After Refer to this sheet in the next few weeks. These instructions provide you with information on caring for yourself after your procedure. Your health care provider may also give you more specific instructions. Your treatment has been planned according to current medical practices, but problems sometimes occur. Call your health care provider if you have any problems or questions after your procedure.  WHAT TO EXPECT AFTER THE PROCEDURE  After your procedure, it is typical to feel:   Soreness in your throat.   Sick to your stomach (nauseous).   Bloated.  Dizzy.   Fatigued. HOME CARE INSTRUCTIONS  Have a friend or family member stay with you for the first 24 hours after your procedure.  Start taking your usual medicines and eating normally as soon as you feel well enough to do so or as directed by your health care provider. SEEK MEDICAL CARE IF:  You have abdominal pain.   You develop signs of infection, such as:   Chills.   Feeling unwell.  SEEK IMMEDIATE MEDICAL CARE IF:  You have difficulty swallowing.  You have worsening throat, chest, or abdominal pain.  You vomit.  You have bloody or very black stools.  You have a fever.   This information is not intended to replace advice given to you by your health care provider. Make sure you discuss any questions you have with your health care provider.   Document Released: 05/30/2013 Document Reviewed: 05/30/2013 Elsevier Interactive Patient Education 2016 [employer] Inc[employer]   PATIENT INSTRUCTIONS POST-ANESTHESIA  IMMEDIATELY FOLLOWING SURGERY:  Do not drive or operate machinery for the first twenty four hours after surgery.   Do not make any important decisions for twenty four hours after surgery or while taking narcotic pain medications or sedatives.  If you develop intractable nausea and vomiting or a severe headache please notify your doctor immediately.  FOLLOW-UP:  Please make an appointment with your surgeon as instructed. You do not need to follow up with anesthesia unless specifically instructed to do so.  WOUND CARE INSTRUCTIONS (if applicable):  Keep a dry clean dressing on the anesthesia/puncture wound site if there is drainage.  Once the wound has quit draining you may leave it open to air.  Generally you should leave the bandage intact for twenty four hours unless there is drainage.  If the epidural site drains for more than 36-48 hours please call the anesthesia department.  QUESTIONS?:  Please feel free to call your physician or the hospital operator if you have any questions, and they will be happy to assist you.

## 2015-10-31 NOTE — Telephone Encounter (Signed)
Patient needs LFTs, lipase done next Thursday 11/05/15.

## 2015-10-31 NOTE — Anesthesia Postprocedure Evaluation (Signed)
Anesthesia Post Note  Patient: HARUKI BURDICK  Procedure(s) Performed: Procedure(s) (LRB): ENDOSCOPIC RETROGRADE CHOLANGIOPANCREATOGRAPHY (ERCP) WITH BALLOON EXTRACTION (N/A) SPHINCTEROTOMY (N/A)  Patient location during evaluation: PACU Anesthesia Type: General Level of consciousness: awake, patient cooperative, awake and alert, oriented and responds to stimulation Pain management: pain level controlled Vital Signs Assessment: post-procedure vital signs reviewed and stable Respiratory status: spontaneous breathing and patient connected to face mask oxygen Cardiovascular status: stable Anesthetic complications: no    Last Vitals:  Filed Vitals:   10/31/15 0855 10/31/15 0900  BP: 125/75 139/75  Pulse:    Temp:    Resp: 16 15    Last Pain: There were no vitals filed for this visit.               Manav Pierotti

## 2015-10-31 NOTE — Transfer of Care (Signed)
Immediate Anesthesia Transfer of Care Note  Patient: Anthony Mcmahon  Procedure(s) Performed: Procedure(s) with comments: ENDOSCOPIC RETROGRADE CHOLANGIOPANCREATOGRAPHY (ERCP) WITH BALLOON EXTRACTION (N/A) - to be done in OR SPHINCTEROTOMY (N/A)  Patient Location: PACU  Anesthesia Type:General  Level of Consciousness: awake, alert , patient cooperative and responds to stimulation  Airway & Oxygen Therapy: Patient Spontanous Breathing and Patient connected to face mask oxygen  Post-op Assessment: Report given to RN, Post -op Vital signs reviewed and stable and Patient moving all extremities  Post vital signs: Reviewed and stable  Last Vitals:  Filed Vitals:   10/31/15 0855 10/31/15 0900  BP: 125/75 139/75  Pulse:    Temp:    Resp: 16 15    Complications: No apparent anesthesia complications

## 2015-11-01 NOTE — Addendum Note (Signed)
Addendum  created 11/01/15 1323 by Charmaine Downs, CRNA   Modules edited: Clinical Notes   Clinical Notes:  File: [id number]

## 2015-11-01 NOTE — Anesthesia Postprocedure Evaluation (Signed)
Anesthesia Post Note  Patient: Anthony Mcmahon  Procedure(s) Performed: Procedure(s) (LRB): ENDOSCOPIC RETROGRADE CHOLANGIOPANCREATOGRAPHY (ERCP) WITH BALLOON EXTRACTION (N/A) SPHINCTEROTOMY (N/A)  Patient location during evaluation: Nursing Unit Anesthesia Type: General Level of consciousness: awake and alert, oriented and patient cooperative Pain management: pain level controlled Vital Signs Assessment: post-procedure vital signs reviewed and stable Respiratory status: spontaneous breathing Cardiovascular status: blood pressure returned to baseline and stable Postop Assessment: no signs of nausea or vomiting Anesthetic complications: no    Last Vitals:  Filed Vitals:   10/31/15 1100 10/31/15 1119  BP: 124/72 130/75  Pulse: 69 67  Temp:  36.9 C  Resp: 15 18    Last Pain: There were no vitals filed for this visit.               Everitt Wenner J

## 2015-11-03 ENCOUNTER — Telehealth: Payer: Self-pay

## 2015-11-03 ENCOUNTER — Other Ambulatory Visit: Payer: Self-pay

## 2015-11-03 DIAGNOSIS — R17 Unspecified jaundice: Secondary | ICD-10-CM

## 2015-11-03 NOTE — Telephone Encounter (Signed)
Letter at the front desk

## 2015-11-03 NOTE — Telephone Encounter (Signed)
Pt called this morning wanting to talk to LSL. I told him that she was at the hospital and would not be here until 10:30. He would like for LSL to call him at [phone]

## 2015-11-03 NOTE — Telephone Encounter (Signed)
Patient's phone number is NOT as listed. It is [phone].  Urine lightened up more. Feels good. Aware to get labs on Thursday at [facility].   Patient needs a letter covering today's absence for FMLA and to be able to return to work starting tomorrow. He plans to come by and pick up this afternoon.

## 2015-11-03 NOTE — Telephone Encounter (Signed)
Lab order done and mailed to the pt. Per LSL, pt needs to have done on Thursday 11/06/15.

## 2015-11-04 ENCOUNTER — Encounter (HOSPITAL_COMMUNITY): Payer: Self-pay | Admitting: Internal Medicine

## 2015-11-07 ENCOUNTER — Other Ambulatory Visit: Payer: Self-pay | Admitting: Gastroenterology

## 2015-11-07 ENCOUNTER — Other Ambulatory Visit: Payer: Self-pay

## 2015-11-07 DIAGNOSIS — R17 Unspecified jaundice: Secondary | ICD-10-CM

## 2015-11-07 LAB — HEPATIC FUNCTION PANEL
ALT: 121 U/L — AB (ref 9–46)
AST: 83 U/L — AB (ref 10–35)
Albumin: 3.5 g/dL — ABNORMAL LOW (ref 3.6–5.1)
Alkaline Phosphatase: 228 U/L — ABNORMAL HIGH (ref 40–115)
BILIRUBIN DIRECT: 2.4 mg/dL — AB (ref <=0.2)
BILIRUBIN INDIRECT: 2.6 mg/dL — AB (ref 0.2–1.2)
TOTAL PROTEIN: 6 g/dL — AB (ref 6.1–8.1)
Total Bilirubin: 5 mg/dL — ABNORMAL HIGH (ref 0.2–1.2)

## 2015-11-07 LAB — LIPASE: Lipase: 141 U/L — ABNORMAL HIGH (ref 7–60)

## 2015-11-07 NOTE — Progress Notes (Signed)
Quick Note:  Please let patient know his LFTs are improved. His bili is down to 5. His alkphos is slightly improved, AST/ALT about the same.  If he is feeling okay, I would advise rechecking his numbers in one week. ______

## 2015-11-07 NOTE — Telephone Encounter (Signed)
Opened in error

## 2015-11-13 ENCOUNTER — Ambulatory Visit: Payer: BLUE CROSS/BLUE SHIELD | Admitting: Gastroenterology

## 2015-11-14 LAB — HEPATIC FUNCTION PANEL
ALBUMIN: 3.7 g/dL (ref 3.6–5.1)
ALT: 108 U/L — ABNORMAL HIGH (ref 9–46)
AST: 72 U/L — AB (ref 10–35)
Alkaline Phosphatase: 172 U/L — ABNORMAL HIGH (ref 40–115)
BILIRUBIN TOTAL: 3.6 mg/dL — AB (ref 0.2–1.2)
Bilirubin, Direct: 1.5 mg/dL — ABNORMAL HIGH (ref <=0.2)
Indirect Bilirubin: 2.1 mg/dL — ABNORMAL HIGH (ref 0.2–1.2)
TOTAL PROTEIN: 6.3 g/dL (ref 6.1–8.1)

## 2015-11-14 NOTE — Progress Notes (Signed)
Quick Note:  Please let patient know his LFTs continue to improve but we need to follow back to baseline.  Discussed case with Dr. Gala Romney. Recheck LFTs in 2 weeks. ______

## 2015-11-17 ENCOUNTER — Ambulatory Visit (INDEPENDENT_AMBULATORY_CARE_PROVIDER_SITE_OTHER): Payer: BLUE CROSS/BLUE SHIELD | Admitting: Gastroenterology

## 2015-11-17 ENCOUNTER — Encounter: Payer: Self-pay | Admitting: Gastroenterology

## 2015-11-17 VITALS — BP 130/83 | HR 77 | Temp 97.1°F | Ht 69.0 in | Wt 197.6 lb

## 2015-11-17 DIAGNOSIS — R634 Abnormal weight loss: Secondary | ICD-10-CM | POA: Diagnosis not present

## 2015-11-17 DIAGNOSIS — K8050 Calculus of bile duct without cholangitis or cholecystitis without obstruction: Secondary | ICD-10-CM | POA: Insufficient documentation

## 2015-11-17 DIAGNOSIS — R7989 Other specified abnormal findings of blood chemistry: Secondary | ICD-10-CM

## 2015-11-17 DIAGNOSIS — R945 Abnormal results of liver function studies: Secondary | ICD-10-CM

## 2015-11-17 NOTE — Progress Notes (Signed)
Primary Care Physician: Vicenta Aly, FNP  Primary Gastroenterologist:  Garfield Cornea, MD   Chief Complaint  Patient presents with  . Follow-up    HPI: Anthony Mcmahon is a 65 y.o. male here for follow-up post ERCP with sphincterotomy and stone removal. Patient presented with 2-3 week history of jaundice, multiple loose stools, weight loss back on 10/27/2015. Total bilirubin 24.3 at the highest. CT with extrahepatic biliary dilation of 10 mm, ultrasound unremarkable. Patient otherwise clinically asymptomatic. He had reported an unintentional weight loss of 25 pounds over 3 month period of time. He is also 5 additional pounds since March 6. Underwent ERCP on March 10, Dr. Laural Golden performed covering for our providers. He had mild dilation of the common bile duct and common hepatic duct with stone causing obstruction. Complete removal of stones accomplished by biliary sphincterotomy and balloon extraction. He did not require stenting.  We have been following his LFTs and lipase since that time. He's had marked improvement but have not normalized completely. His lipase did get up to 417 couple weeks ago, recheck was 141 post ERCP. Last LFTs on March 23 with total bilirubin of 3.6, indirect bilirubin 2.1, alkaline phosphatase 172, AST 72, ALT 108.  Denies abdominal pain. Continues to have some loose stools. He notes that he had to quit metformin 1 year ago due to explosive bowel movements. He did this on his own. He notes his sugars continue to run in the 200-300 range since this illness began. Still on Januvia. He is also worried about his weight. Continues to drop weight, approximally 30 pounds over the past several months.    Current Outpatient Prescriptions  Medication Sig Dispense Refill  . Cholecalciferol (VITAMIN D) 2000 units tablet Take by mouth.    Marland Kitchen lisinopril (PRINIVIL,ZESTRIL) 5 MG tablet Take by mouth.    . meloxicam (MOBIC) 15 MG tablet Take 1 tablet (15 mg total) by  mouth daily.    . Probiotic Product (PROBIOTIC DAILY PO) Take by mouth daily.    . sitaGLIPtin (JANUVIA) 100 MG tablet Take by mouth.    . vitamin B-12 (CYANOCOBALAMIN) 1000 MCG tablet Take 1,000 mcg by mouth daily.     No current facility-administered medications for this visit.    Allergies as of 11/17/2015  . (No Known Allergies)    ROS:  General: Negative for anorexia, weight loss, fever, chills, fatigue, weakness. ENT: Negative for hoarseness, difficulty swallowing , nasal congestion. CV: Negative for chest pain, angina, palpitations, dyspnea on exertion, peripheral edema.  Respiratory: Negative for dyspnea at rest, dyspnea on exertion, cough, sputum, wheezing.  GI: See history of present illness. GU:  Negative for dysuria, hematuria, urinary incontinence, urinary frequency, nocturnal urination.  Endo: Negative for unusual weight change.    Physical Examination:   BP 130/83 mmHg  Pulse 77  Temp(Src) 97.1 F (36.2 C)  Ht 5\' 9"  (1.753 m)  Wt 197 lb 9.6 oz (89.631 kg)  BMI 29.17 kg/m2  General: Well-nourished, well-developed in no acute distress.  Eyes: No icterus. Mouth: Oropharyngeal mucosa moist and pink , no lesions erythema or exudate. Lungs: Clear to auscultation bilaterally.  Heart: Regular rate and rhythm, no murmurs rubs or gallops.  Abdomen: Bowel sounds are normal, nontender, nondistended, no hepatosplenomegaly or masses, no abdominal bruits or hernia , no rebound or guarding.   Extremities: No lower extremity edema. No clubbing or deformities. Neuro: Alert and oriented x 4   Skin: Warm and dry, no jaundice.   Psych: Alert  and cooperative, normal mood and affect.  Labs:  Lab Results  Component Value Date   CREATININE 0.92 10/27/2015   BUN 18 10/27/2015   NA 133* 10/27/2015   K 4.4 10/27/2015   CL 97* 10/27/2015   CO2 26 10/27/2015   Lab Results  Component Value Date   ALT 108* 11/13/2015   AST 72* 11/13/2015   ALKPHOS 172* 11/13/2015   BILITOT  3.6* 11/13/2015   Lab Results  Component Value Date   WBC 10.7* 10/28/2015   HGB 15.0 10/28/2015   HCT 45.5 10/28/2015   MCV 90.8 10/28/2015   PLT 229 10/28/2015   Lab Results  Component Value Date   LIPASE 141* 11/06/2015    Imaging Studies: Dg Ercp Biliary & Pancreatic Ducts  10/31/2015  CLINICAL DATA:  ERCP WITH SPHINCTEROTOMY, NO STENT PLACED, ON SINGLE STONE REMOVED EXAM: ERCP TECHNIQUE: Multiple spot images obtained with the fluoroscopic device and submitted for interpretation post-procedure. FLUOROSCOPY TIME:  2 MINS 33 SECS COMPARISON:  CT 07/30/2010 FINDINGS: Multiple fluoroscopic spot images document endoscopic cannulation and opacification of the CBD. On the first run there is a mobile filling defect in the CBD. Central intrahepatic bile ducts are incompletely evaluated, appear decompressed centrally. Subsequent images document catheter placement through the CBD. IMPRESSION: 1. Mobile filling defects in the CBD, possible choledocholithiasis, with endoscopic intervention as above. These images were submitted for radiologic interpretation only. Please see the procedural report for the amount of contrast and the fluoroscopy time utilized. Electronically Signed   By: Lucrezia Europe M.D.   On: 10/31/2015 13:56

## 2015-11-17 NOTE — Patient Instructions (Signed)
1. Please have your labs done next Thursday or Friday.  2. Continue to weigh yourself. Call if ongoing weight loss.

## 2015-11-17 NOTE — Assessment & Plan Note (Signed)
65 year old gentleman with recent obstructive jaundice felt to be related to choledocholithiasis. He is status post ERCP with sphincterotomy and stone removal. His LFTs and lipase have improved but have not normalized. Clinically he feels well. Continues to lose weight which is a concern. Patient will continue to weigh himself at least weekly and keep me posted if he has ongoing weight loss. He continues to have poorly controlled sugars and will likely need to address this with his PCP in the near future. We will continue to follow his LFTs and lipase back down to normal, recheck next week. Further recommendations to follow.

## 2015-11-18 NOTE — Progress Notes (Signed)
cc'ed to pcp °

## 2015-11-29 LAB — HEPATIC FUNCTION PANEL
ALK PHOS: 119 U/L — AB (ref 40–115)
ALT: 38 U/L (ref 9–46)
AST: 27 U/L (ref 10–35)
Albumin: 3.8 g/dL (ref 3.6–5.1)
BILIRUBIN DIRECT: 0.7 mg/dL — AB (ref <=0.2)
BILIRUBIN INDIRECT: 1.6 mg/dL — AB (ref 0.2–1.2)
BILIRUBIN TOTAL: 2.3 mg/dL — AB (ref 0.2–1.2)
Total Protein: 6.3 g/dL (ref 6.1–8.1)

## 2015-11-29 LAB — LIPASE: Lipase: 89 U/L — ABNORMAL HIGH (ref 7–60)

## 2015-12-02 ENCOUNTER — Telehealth: Payer: Self-pay | Admitting: Internal Medicine

## 2015-12-02 NOTE — Telephone Encounter (Signed)
PLEASE CALL PATIENT IF LABS ARE READY

## 2015-12-02 NOTE — Telephone Encounter (Signed)
Routing to AS and EG since LSL is not here

## 2015-12-03 NOTE — Telephone Encounter (Signed)
Reviewed.See result note. 

## 2015-12-08 NOTE — Progress Notes (Signed)
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 ERCP with stone removal over 5 weeks ago. Still continue to follow or would you do any repeat imaging? ______

## 2015-12-09 NOTE — Progress Notes (Signed)
Quick Note:  Please let patient know that his labs continue to improve but as discussed previously Dr. Gala Romney wants [place] to follow back to normal.  Repeat LFTs, lipase in 2 weeks. ______

## 2015-12-10 ENCOUNTER — Other Ambulatory Visit: Payer: Self-pay

## 2015-12-10 ENCOUNTER — Other Ambulatory Visit: Payer: Self-pay | Admitting: Gastroenterology

## 2015-12-10 DIAGNOSIS — R7989 Other specified abnormal findings of blood chemistry: Secondary | ICD-10-CM

## 2015-12-10 DIAGNOSIS — K8050 Calculus of bile duct without cholangitis or cholecystitis without obstruction: Secondary | ICD-10-CM

## 2015-12-10 DIAGNOSIS — R945 Abnormal results of liver function studies: Secondary | ICD-10-CM

## 2015-12-24 LAB — HEPATIC FUNCTION PANEL
ALBUMIN: 4.1 g/dL (ref 3.6–5.1)
ALT: 34 U/L (ref 9–46)
AST: 25 U/L (ref 10–35)
Alkaline Phosphatase: 114 U/L (ref 40–115)
BILIRUBIN TOTAL: 1.1 mg/dL (ref 0.2–1.2)
Bilirubin, Direct: 0.2 mg/dL (ref <=0.2)
Indirect Bilirubin: 0.9 mg/dL (ref 0.2–1.2)
TOTAL PROTEIN: 6.6 g/dL (ref 6.1–8.1)

## 2015-12-24 LAB — LIPASE: LIPASE: 44 U/L (ref 7–60)

## 2015-12-24 NOTE — Progress Notes (Signed)
Quick Note:  Please let the patient know his LFTs are NORMAL! No further labs needed. Please send copy to PCP. ______

## 2016-03-01 NOTE — Progress Notes (Signed)
Quick Note:  . ______ 

## 2016-04-21 ENCOUNTER — Ambulatory Visit: Payer: BLUE CROSS/BLUE SHIELD | Admitting: Urology

## 2016-07-28 IMAGING — RF DG ERCP WO/W SPHINCTEROTOMY
1 series · 8 of 8 positions shown · non-contrast
Comparison: CT 07/30/2010

CLINICAL DATA: ERCP WITH SPHINCTEROTOMY, NO STENT PLACED, ON SINGLE
STONE REMOVED

EXAM:
ERCP
TECHNIQUE: Multiple spot images obtained with the fluoroscopic device and
submitted for interpretation post-procedure.
FLUOROSCOPY TIME:  2 MINS 33 SECS

[Series 1: run · 5 acquisitions, 8 frames shown]
[im 1/5]
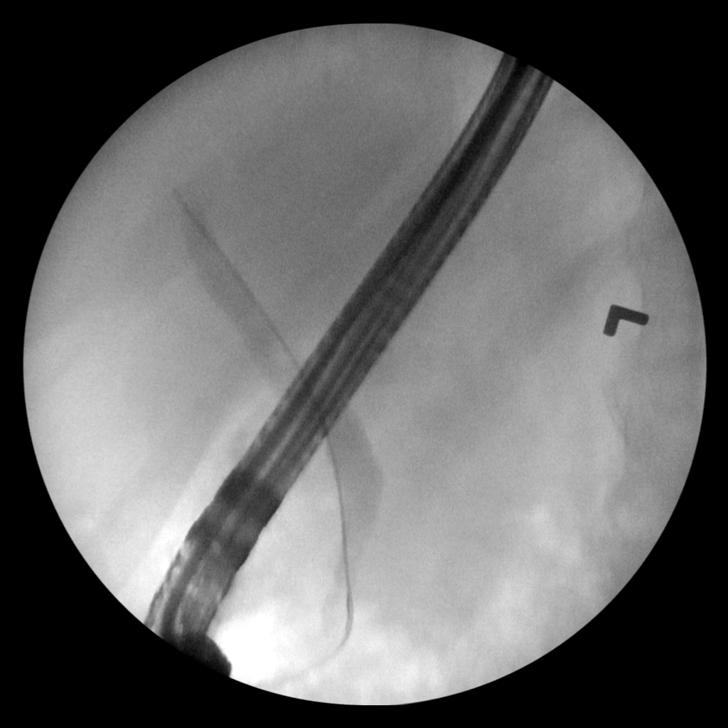
[im 1/5]
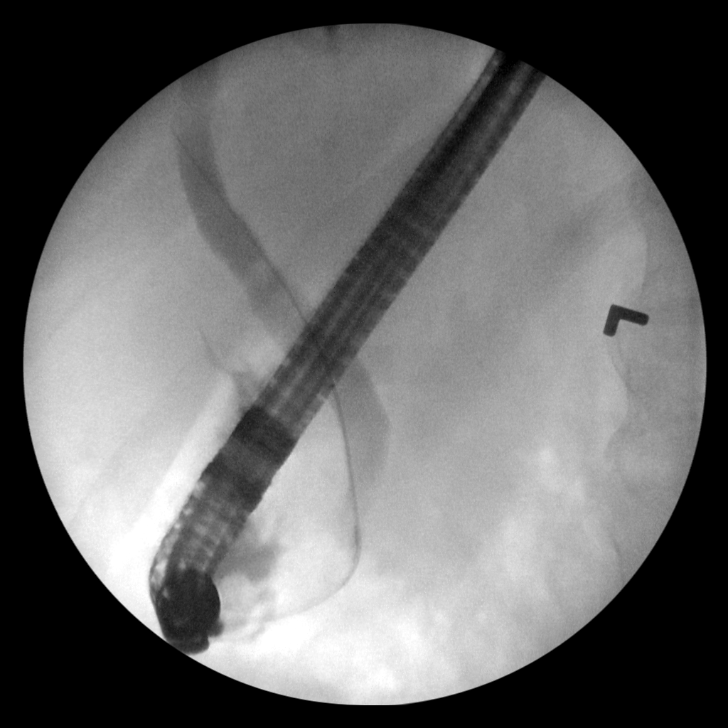
[im 1/5]
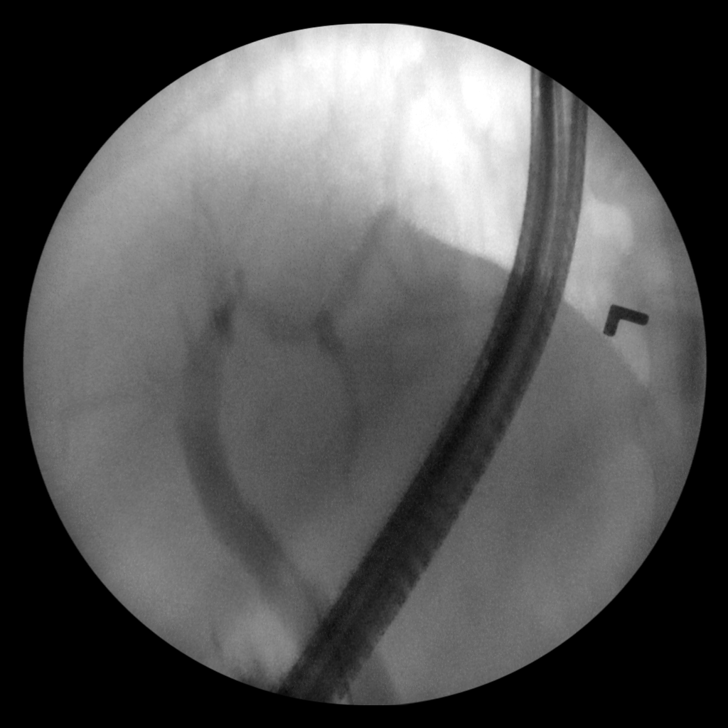
[im 1/5]
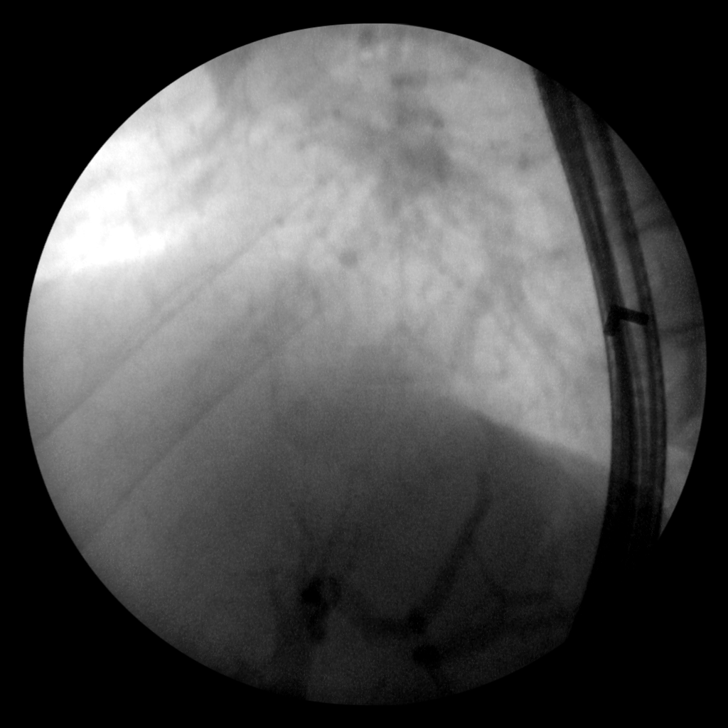
[im 2/5]
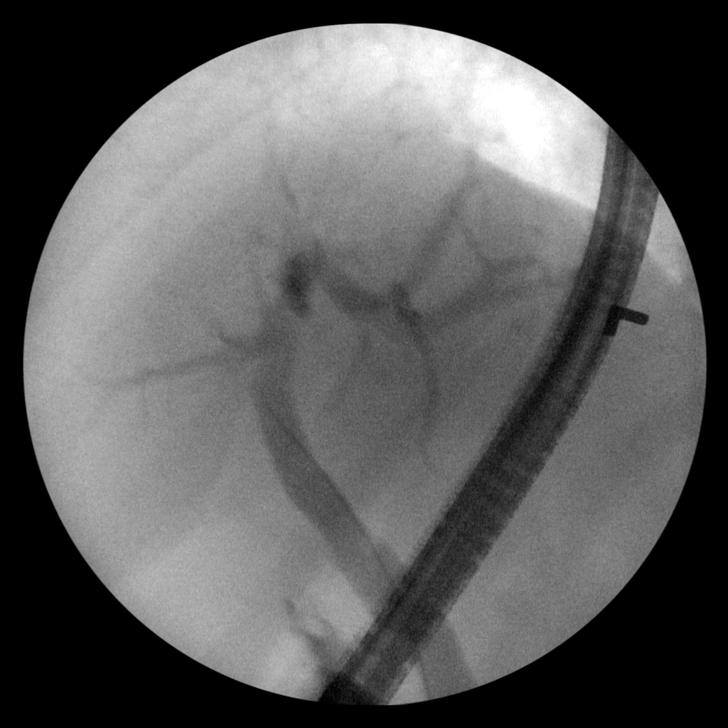
[im 3/5]
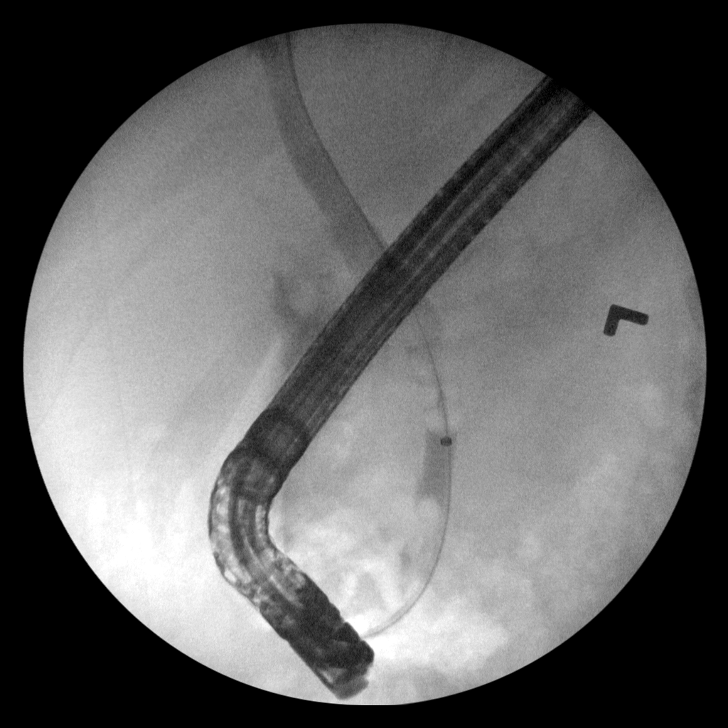
[im 4/5]
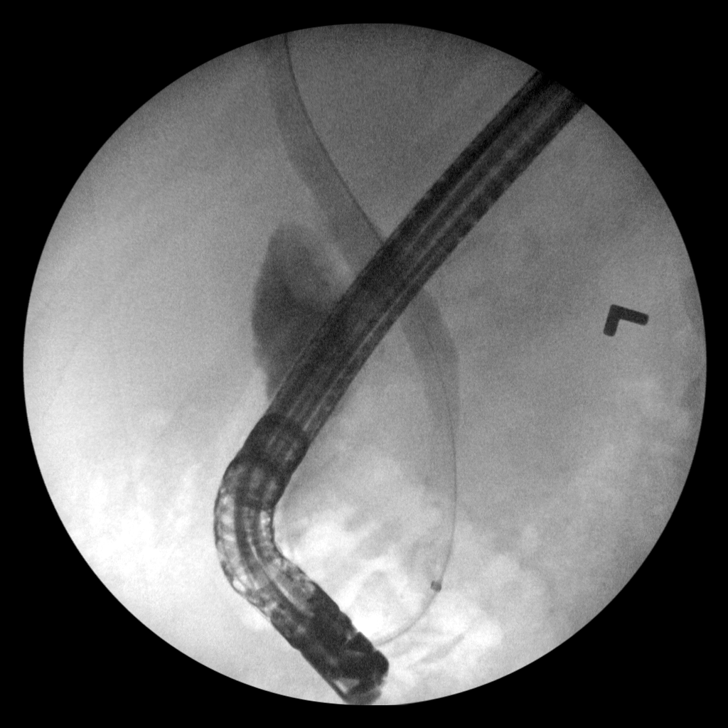
[im 5/5]
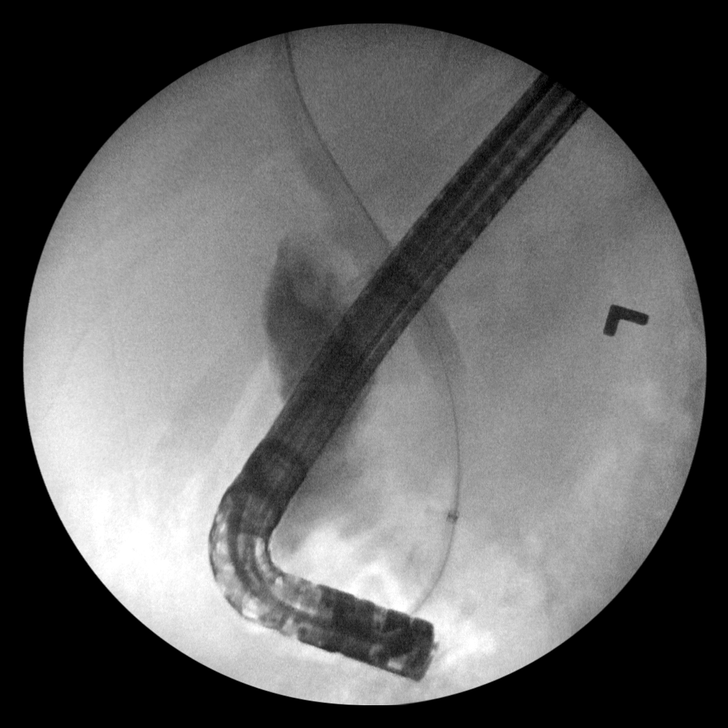

[8 of 8 positions shown; findings below may reference images not displayed]

FINDINGS: Multiple fluoroscopic spot images document endoscopic cannulation
and opacification of the CBD. On the first run there is a mobile
filling defect in the CBD. Central intrahepatic bile ducts are
incompletely evaluated, appear decompressed centrally. Subsequent
images document catheter placement through the CBD.
IMPRESSION: 1. Mobile filling defects in the CBD, possible choledocholithiasis,
with endoscopic intervention as above.
These images were submitted for radiologic interpretation only.
Please see the procedural report for the amount of contrast and the
fluoroscopy time utilized.

## 2016-08-03 ENCOUNTER — Encounter: Payer: Self-pay | Admitting: Internal Medicine

## 2017-12-22 ENCOUNTER — Encounter (HOSPITAL_BASED_OUTPATIENT_CLINIC_OR_DEPARTMENT_OTHER): Payer: BLUE CROSS/BLUE SHIELD | Attending: Internal Medicine

## 2017-12-22 ENCOUNTER — Other Ambulatory Visit (HOSPITAL_COMMUNITY)
Admission: RE | Admit: 2017-12-22 | Discharge: 2017-12-22 | Disposition: A | Discharge status: Home / Self Care | Payer: BLUE CROSS/BLUE SHIELD | Source: Other Acute Inpatient Hospital | Attending: Internal Medicine | Admitting: Internal Medicine

## 2017-12-22 DIAGNOSIS — L97813 Non-pressure chronic ulcer of other part of right lower leg with necrosis of muscle: Secondary | ICD-10-CM | POA: Insufficient documentation

## 2017-12-22 DIAGNOSIS — E11622 Type 2 diabetes mellitus with other skin ulcer: Secondary | ICD-10-CM | POA: Diagnosis not present

## 2017-12-22 DIAGNOSIS — L97812 Non-pressure chronic ulcer of other part of right lower leg with fat layer exposed: Secondary | ICD-10-CM | POA: Diagnosis present

## 2017-12-22 DIAGNOSIS — Z7984 Long term (current) use of oral hypoglycemic drugs: Secondary | ICD-10-CM | POA: Diagnosis not present

## 2017-12-22 DIAGNOSIS — L97212 Non-pressure chronic ulcer of right calf with fat layer exposed: Secondary | ICD-10-CM | POA: Diagnosis not present

## 2017-12-22 DIAGNOSIS — Z87891 Personal history of nicotine dependence: Secondary | ICD-10-CM | POA: Insufficient documentation

## 2017-12-22 DIAGNOSIS — I10 Essential (primary) hypertension: Secondary | ICD-10-CM | POA: Diagnosis not present

## 2017-12-25 LAB — AEROBIC CULTURE W GRAM STAIN (SUPERFICIAL SPECIMEN)

## 2017-12-25 LAB — AEROBIC CULTURE  (SUPERFICIAL SPECIMEN)

## 2017-12-29 DIAGNOSIS — E11622 Type 2 diabetes mellitus with other skin ulcer: Secondary | ICD-10-CM | POA: Diagnosis not present

## 2018-01-05 DIAGNOSIS — E11622 Type 2 diabetes mellitus with other skin ulcer: Secondary | ICD-10-CM | POA: Diagnosis not present

## 2018-01-12 DIAGNOSIS — E11622 Type 2 diabetes mellitus with other skin ulcer: Secondary | ICD-10-CM | POA: Diagnosis not present

## 2018-01-19 DIAGNOSIS — E11622 Type 2 diabetes mellitus with other skin ulcer: Secondary | ICD-10-CM | POA: Diagnosis not present

## 2018-01-26 ENCOUNTER — Encounter (HOSPITAL_BASED_OUTPATIENT_CLINIC_OR_DEPARTMENT_OTHER): Payer: BLUE CROSS/BLUE SHIELD | Attending: Internal Medicine

## 2018-01-26 DIAGNOSIS — L97212 Non-pressure chronic ulcer of right calf with fat layer exposed: Secondary | ICD-10-CM | POA: Insufficient documentation

## 2018-01-26 DIAGNOSIS — I10 Essential (primary) hypertension: Secondary | ICD-10-CM | POA: Diagnosis not present

## 2018-01-26 DIAGNOSIS — L97812 Non-pressure chronic ulcer of other part of right lower leg with fat layer exposed: Secondary | ICD-10-CM | POA: Insufficient documentation

## 2018-01-26 DIAGNOSIS — E11622 Type 2 diabetes mellitus with other skin ulcer: Secondary | ICD-10-CM | POA: Diagnosis not present

## 2018-02-02 DIAGNOSIS — E11622 Type 2 diabetes mellitus with other skin ulcer: Secondary | ICD-10-CM | POA: Diagnosis not present

## 2018-02-09 DIAGNOSIS — E11622 Type 2 diabetes mellitus with other skin ulcer: Secondary | ICD-10-CM | POA: Diagnosis not present

## 2018-04-04 ENCOUNTER — Encounter (HOSPITAL_BASED_OUTPATIENT_CLINIC_OR_DEPARTMENT_OTHER): Payer: BLUE CROSS/BLUE SHIELD
